# Patient Record
Sex: Male | Born: 1965 | Race: White | Hispanic: No | Marital: Married | Smoking: Former smoker
Health system: Southern US, Community
[De-identification: ages and names within clinical notes are randomized; demographics above are authoritative.]

## PROBLEM LIST (undated history)

## (undated) DIAGNOSIS — Z720 Tobacco use: Secondary | ICD-10-CM

## (undated) DIAGNOSIS — I255 Ischemic cardiomyopathy: Secondary | ICD-10-CM

## (undated) DIAGNOSIS — Z9289 Personal history of other medical treatment: Secondary | ICD-10-CM

## (undated) DIAGNOSIS — I2510 Atherosclerotic heart disease of native coronary artery without angina pectoris: Secondary | ICD-10-CM

## (undated) DIAGNOSIS — D751 Secondary polycythemia: Secondary | ICD-10-CM

## (undated) DIAGNOSIS — I219 Acute myocardial infarction, unspecified: Secondary | ICD-10-CM

## (undated) DIAGNOSIS — M109 Gout, unspecified: Secondary | ICD-10-CM

## (undated) DIAGNOSIS — M1990 Unspecified osteoarthritis, unspecified site: Secondary | ICD-10-CM

## (undated) DIAGNOSIS — E785 Hyperlipidemia, unspecified: Secondary | ICD-10-CM

## (undated) HISTORY — PX: CARDIAC SURGERY: SHX584

## (undated) HISTORY — PX: COLONOSCOPY W/ BIOPSIES: SHX1374

## (undated) HISTORY — PX: DENTAL SURGERY: SHX609

## (undated) HISTORY — DX: Acute myocardial infarction, unspecified: I21.9

---
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
## 1999-06-22 HISTORY — PX: MANDIBLE FRACTURE SURGERY: SHX706

## 1999-06-22 HISTORY — PX: MEDIAL COLLATERAL LIGAMENT REPAIR, KNEE: SHX2019

## 1999-06-22 HISTORY — PX: KNEE ARTHROSCOPY W/ ACL RECONSTRUCTION: SHX1858

## 2000-08-25 ENCOUNTER — Emergency Department (HOSPITAL_COMMUNITY): Admission: EM | Admit: 2000-08-25 | Discharge: 2000-08-25 | Payer: Self-pay | Admitting: Emergency Medicine

## 2000-08-25 ENCOUNTER — Encounter: Payer: Self-pay | Admitting: Emergency Medicine

## 2000-09-05 ENCOUNTER — Encounter: Payer: Self-pay | Admitting: Specialist

## 2000-09-05 ENCOUNTER — Ambulatory Visit (HOSPITAL_COMMUNITY): Admission: RE | Admit: 2000-09-05 | Discharge: 2000-09-05 | Payer: Self-pay | Admitting: Specialist

## 2000-09-14 ENCOUNTER — Observation Stay (HOSPITAL_COMMUNITY): Admission: RE | Admit: 2000-09-14 | Discharge: 2000-09-15 | Payer: Self-pay | Admitting: Specialist

## 2000-12-15 ENCOUNTER — Encounter: Admission: RE | Admit: 2000-12-15 | Discharge: 2001-01-17 | Payer: Self-pay | Admitting: Specialist

## 2001-03-28 ENCOUNTER — Encounter: Admission: RE | Admit: 2001-03-28 | Discharge: 2001-04-21 | Payer: Self-pay | Admitting: *Deleted

## 2005-11-18 ENCOUNTER — Encounter: Admission: RE | Admit: 2005-11-18 | Discharge: 2005-11-18 | Payer: Self-pay | Admitting: Family Medicine

## 2005-12-24 ENCOUNTER — Ambulatory Visit: Payer: Self-pay | Admitting: Family Medicine

## 2006-01-07 ENCOUNTER — Ambulatory Visit: Payer: Self-pay | Admitting: Family Medicine

## 2006-01-17 ENCOUNTER — Ambulatory Visit: Payer: Self-pay | Admitting: Family Medicine

## 2006-02-04 ENCOUNTER — Ambulatory Visit (HOSPITAL_COMMUNITY): Admission: RE | Admit: 2006-02-04 | Discharge: 2006-02-04 | Payer: Self-pay | Admitting: Gastroenterology

## 2006-03-10 ENCOUNTER — Ambulatory Visit (HOSPITAL_COMMUNITY): Admission: RE | Admit: 2006-03-10 | Discharge: 2006-03-10 | Payer: Self-pay | Admitting: Gastroenterology

## 2006-03-28 ENCOUNTER — Encounter: Admission: RE | Admit: 2006-03-28 | Discharge: 2006-03-28 | Payer: Self-pay | Admitting: Gastroenterology

## 2006-04-26 ENCOUNTER — Inpatient Hospital Stay (HOSPITAL_COMMUNITY): Admission: AD | Admit: 2006-04-26 | Discharge: 2006-04-29 | Payer: Self-pay | Admitting: Gastroenterology

## 2006-08-19 ENCOUNTER — Ambulatory Visit: Payer: Self-pay | Admitting: Family Medicine

## 2007-06-24 IMAGING — CT CT ABDOMEN W/ CM
2 of 5 series · 17 of 46 positions shown, 19 images · IV contrast (APPLIED)
Comparison: none

CLINICAL DATA: History of pancreatitis with pseudocyst formation in the tail of the pancreas.  The patient now has recurrent abdominal pain.  
 ABDOMEN CT WITH CONTRAST:
TECHNIQUE: Multidetector CT imaging of the abdomen was performed following the standard protocol during bolus administration of intravenous contrast.
 Contrast:  100 cc Omnipaque 300 IV.  Oral contrast was also administered.
TECHNIQUE: Multidetector CT imaging of the pelvis was performed following the standard protocol during bolus administration of intravenous contrast.

[Series 2: abd/pelv with 5.0 b31f st · axial · 0.69mm/px · z∈[-519,-89]mm · 14 of 98 slices shown, 16 images]
[im 6/98  soft-tissue]
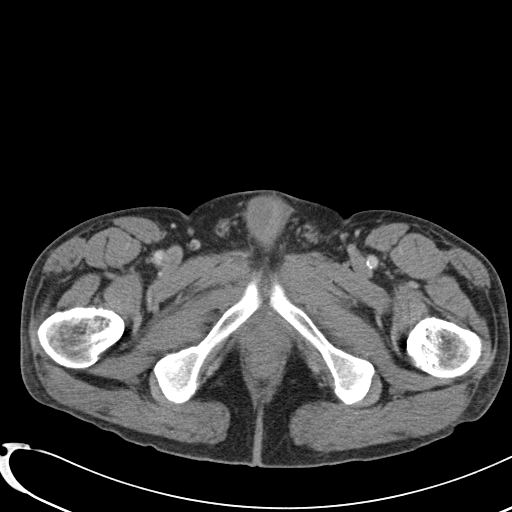
[im 6/98  bone]
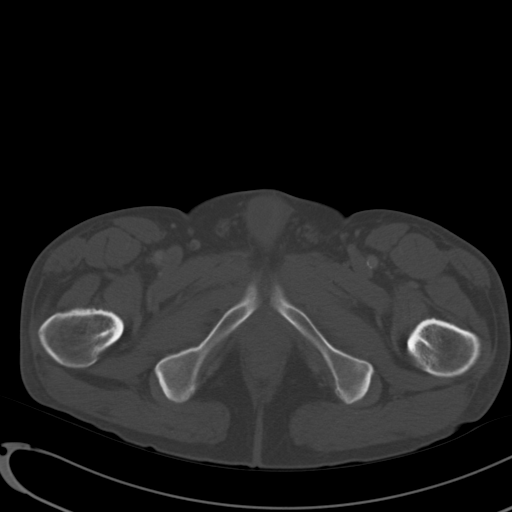
[im 11/98  soft-tissue]
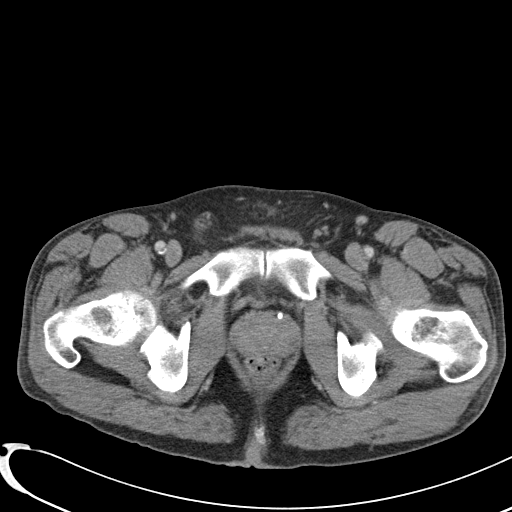
[im 21/98  soft-tissue]
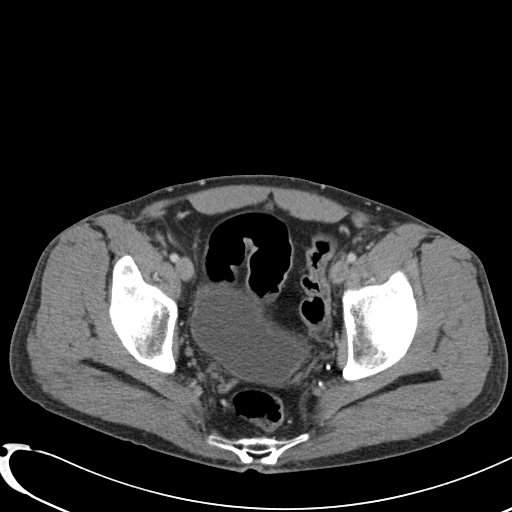
[im 26/98  soft-tissue]
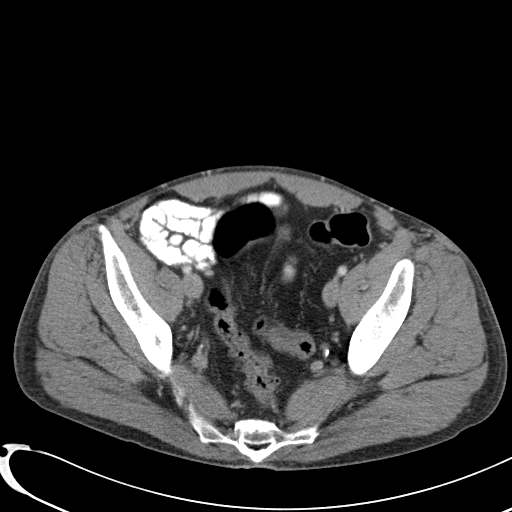
[im 31/98  soft-tissue]
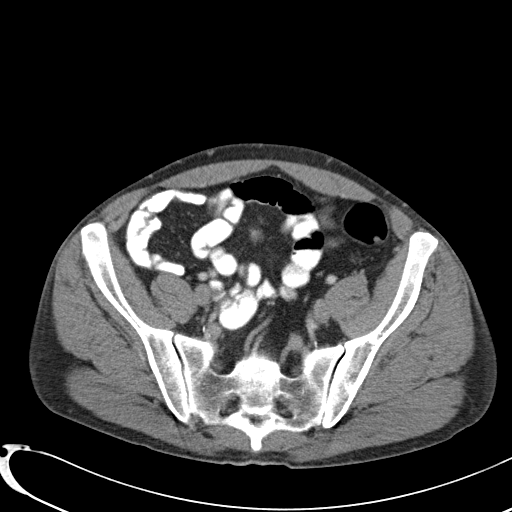
[im 41/98  soft-tissue]
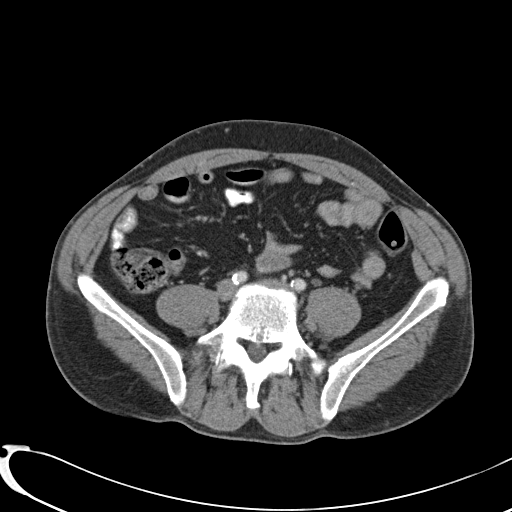
[im 46/98  soft-tissue]
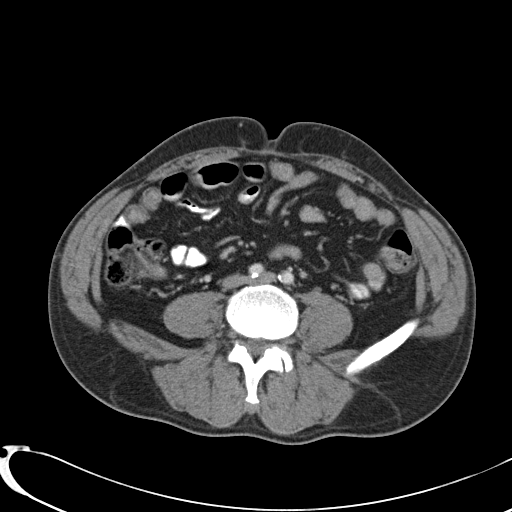
[im 52/98  soft-tissue]
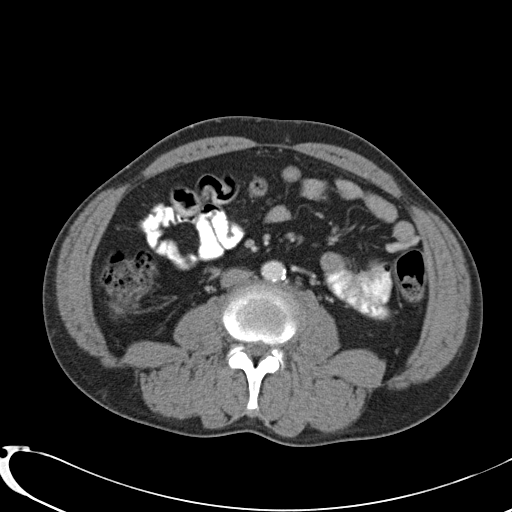
[im 57/98  soft-tissue]
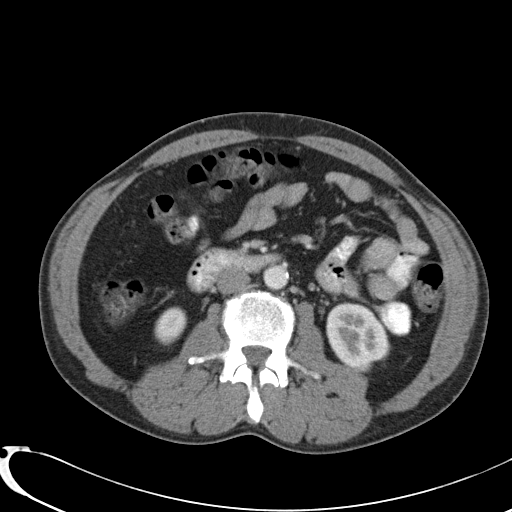
[im 57/98  bone]
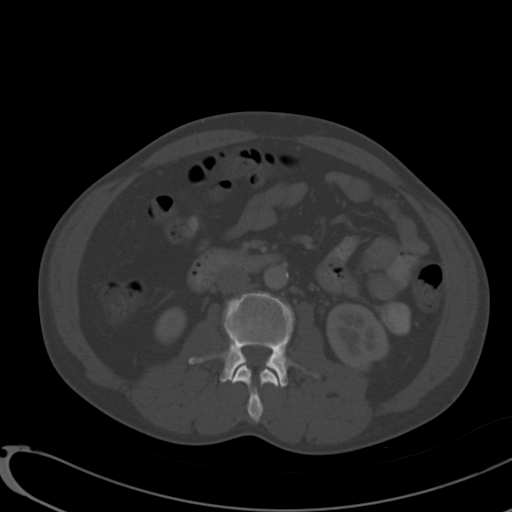
[im 67/98  soft-tissue]
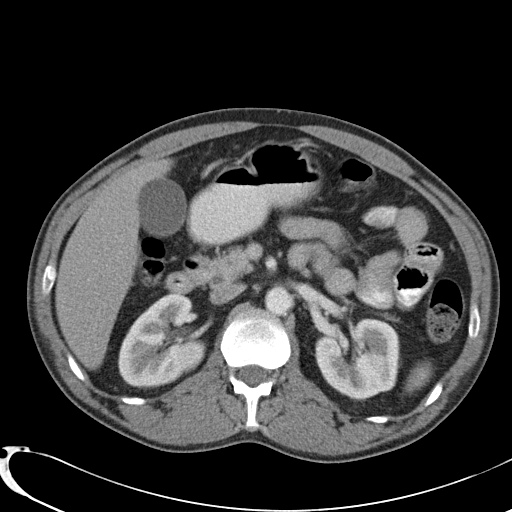
[im 72/98  soft-tissue]
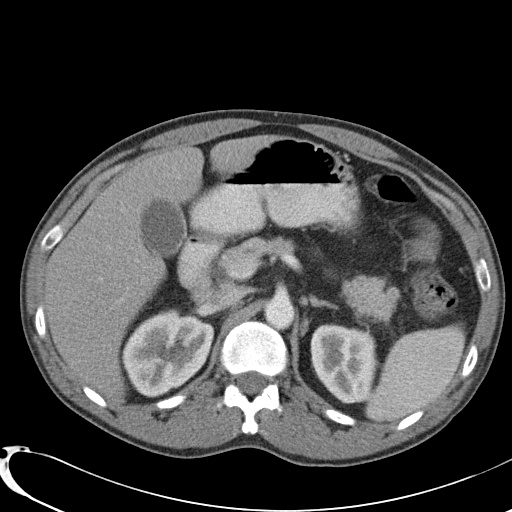
[im 77/98  soft-tissue]
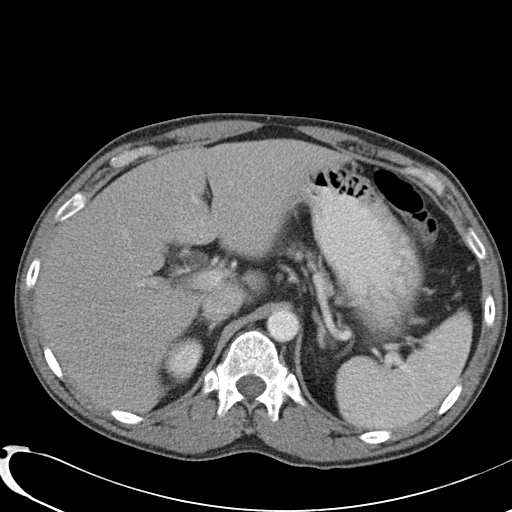
[im 87/98  soft-tissue]
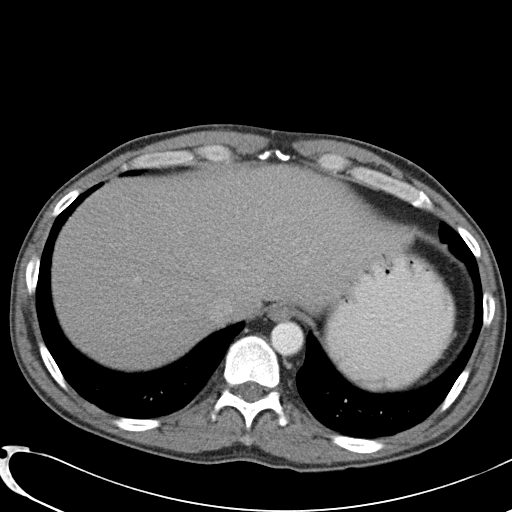
[im 92/98  soft-tissue]
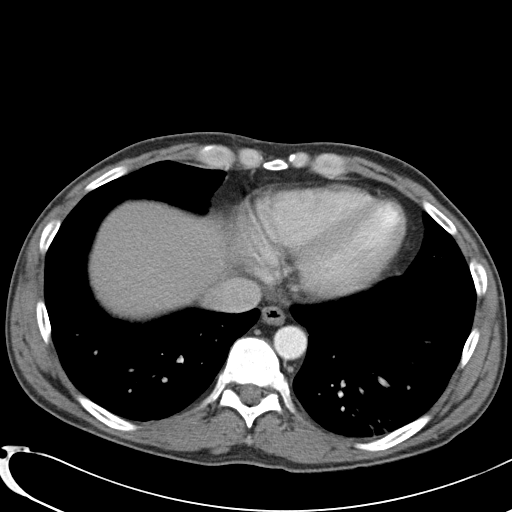

[Series 4: abd/pelv with 2.0 spo st · coronal · 0.95mm/px · 3 of 113 slices shown]
[im 38/113  soft-tissue]
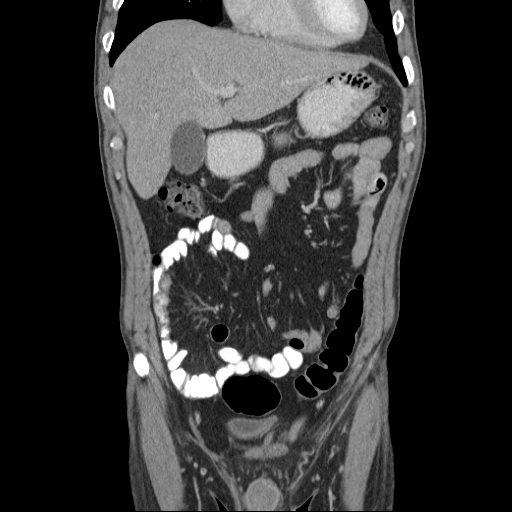
[im 50/113  soft-tissue]
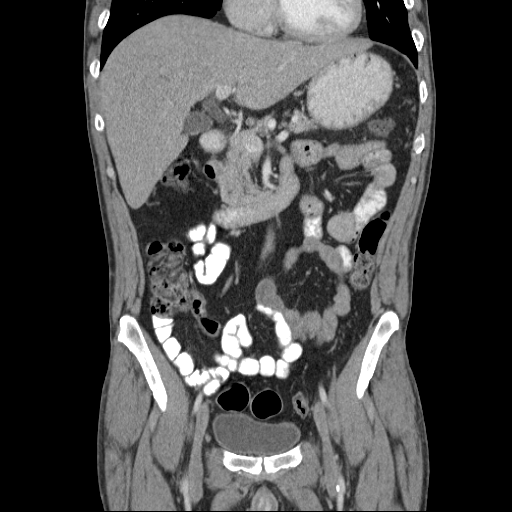
[im 63/113  soft-tissue]
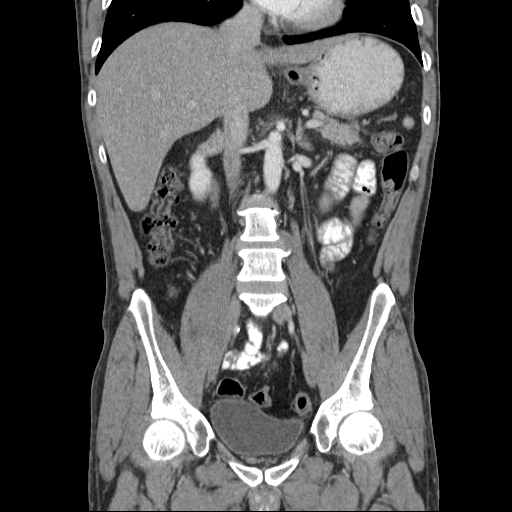

[17 of 46 positions shown; findings below may reference images not displayed]

FINDINGS: The pseudocyst at the tail of the pancreas has nearly completely resolved with only a tiny low attenuation region remaining measuring approximately 1 cm in diameter.  A mild amount of inflammatory change is seen adjacent to the tail of the pancreas.  No new areas of pancreatic inflammation are identified.  There are no new pseudocysts or fluid collections.  The pancreatic duct is not dilated.  The pancreas enhances without evidence of necrosis.  
 The rest of the study shows a stable appearance to the liver, gallbladder, adrenal glands, kidneys and spleen.  Bowel loops are of normal caliber without obstruction.  No free air.
IMPRESSION: Near resolution of pancreatic pseudocyst at the tail of the pancreas as seen on prior imaging.  Only a roughly 1 cm low attenuation area remains with some mild surrounding inflammatory changes.  No new fluid collections or new areas of inflammation. 
 PELVIS CT WITH CONTRAST:
FINDINGS: No free fluid, abscess or bowel dilatation.  The bladder is unremarkable.
IMPRESSION: No acute findings in the pelvis.

## 2012-04-28 ENCOUNTER — Observation Stay (HOSPITAL_COMMUNITY)
Admission: EM | Admit: 2012-04-28 | Discharge: 2012-04-29 | Disposition: A | Discharge status: Home / Self Care | Payer: BC Managed Care – PPO | Attending: Cardiovascular Disease | Admitting: Cardiovascular Disease

## 2012-04-28 ENCOUNTER — Emergency Department (HOSPITAL_COMMUNITY): Payer: BC Managed Care – PPO

## 2012-04-28 ENCOUNTER — Ambulatory Visit (INDEPENDENT_AMBULATORY_CARE_PROVIDER_SITE_OTHER): Payer: BC Managed Care – PPO | Admitting: Family Medicine

## 2012-04-28 ENCOUNTER — Encounter: Payer: Self-pay | Admitting: Family Medicine

## 2012-04-28 ENCOUNTER — Encounter (HOSPITAL_COMMUNITY): Payer: Self-pay | Admitting: General Practice

## 2012-04-28 VITALS — BP 150/80 | HR 103 | Wt 165.0 lb

## 2012-04-28 DIAGNOSIS — D751 Secondary polycythemia: Secondary | ICD-10-CM

## 2012-04-28 DIAGNOSIS — F17200 Nicotine dependence, unspecified, uncomplicated: Secondary | ICD-10-CM | POA: Insufficient documentation

## 2012-04-28 DIAGNOSIS — Z23 Encounter for immunization: Secondary | ICD-10-CM | POA: Insufficient documentation

## 2012-04-28 DIAGNOSIS — R079 Chest pain, unspecified: Secondary | ICD-10-CM

## 2012-04-28 DIAGNOSIS — Z720 Tobacco use: Secondary | ICD-10-CM

## 2012-04-28 DIAGNOSIS — R0789 Other chest pain: Principal | ICD-10-CM | POA: Insufficient documentation

## 2012-04-28 HISTORY — DX: Gout, unspecified: M10.9

## 2012-04-28 HISTORY — DX: Unspecified osteoarthritis, unspecified site: M19.90

## 2012-04-28 HISTORY — DX: Personal history of other medical treatment: Z92.89

## 2012-04-28 LAB — POCT I-STAT, CHEM 8
BUN: 11 mg/dL (ref 6–23)
Calcium, Ion: 1.15 mmol/L (ref 1.12–1.23)
Hemoglobin: 17.7 g/dL — ABNORMAL HIGH (ref 13.0–17.0)
Sodium: 137 mEq/L (ref 135–145)
TCO2: 25 mmol/L (ref 0–100)

## 2012-04-28 LAB — PROTIME-INR
INR: 1 (ref 0.00–1.49)
Prothrombin Time: 13.1 seconds (ref 11.6–15.2)

## 2012-04-28 LAB — POCT I-STAT TROPONIN I: Troponin i, poc: 0.01 ng/mL (ref 0.00–0.08)

## 2012-04-28 MED ORDER — SODIUM CHLORIDE 0.9 % IJ SOLN
3.0000 mL | Freq: Two times a day (BID) | INTRAMUSCULAR | Status: DC
  Administered 2012-04-28: 3 mL via INTRAVENOUS

## 2012-04-28 MED ORDER — BUPROPION HCL ER (SR) 150 MG PO TB12
150.0000 mg | ORAL_TABLET | Freq: Two times a day (BID) | ORAL | Status: DC
  Administered 2012-04-28 – 2012-04-29 (×2): 150 mg via ORAL
  Filled 2012-04-28 (×3): qty 1

## 2012-04-28 MED ORDER — ASPIRIN EC 81 MG PO TBEC
81.0000 mg | DELAYED_RELEASE_TABLET | Freq: Every day | ORAL | Status: DC
  Administered 2012-04-29: 81 mg via ORAL
  Filled 2012-04-28: qty 1

## 2012-04-28 MED ORDER — ASPIRIN 81 MG PO CHEW: 324.0000 mg | CHEWABLE_TABLET | ORAL | Status: DC

## 2012-04-28 MED ORDER — ACETAMINOPHEN 325 MG PO TABS: 650.0000 mg | ORAL_TABLET | ORAL | Status: DC | PRN

## 2012-04-28 MED ORDER — SODIUM CHLORIDE 0.9 % IJ SOLN: 3.0000 mL | INTRAMUSCULAR | Status: DC | PRN

## 2012-04-28 MED ORDER — NITROGLYCERIN 0.4 MG SL SUBL
0.4000 mg | SUBLINGUAL_TABLET | SUBLINGUAL | Status: DC | PRN
  Administered 2012-04-28: 0.4 mg via SUBLINGUAL
  Filled 2012-04-28 (×2): qty 25

## 2012-04-28 MED ORDER — PNEUMOCOCCAL VAC POLYVALENT 25 MCG/0.5ML IJ INJ
0.5000 mL | INJECTION | INTRAMUSCULAR | Status: AC
  Administered 2012-04-29: 0.5 mL via INTRAMUSCULAR
  Filled 2012-04-28: qty 0.5

## 2012-04-28 MED ORDER — ONDANSETRON HCL 4 MG/2ML IJ SOLN: 4.0000 mg | Freq: Four times a day (QID) | INTRAMUSCULAR | Status: DC | PRN

## 2012-04-28 MED ORDER — ASPIRIN 300 MG RE SUPP
300.0000 mg | RECTAL | Status: DC
  Filled 2012-04-28: qty 1

## 2012-04-28 MED ORDER — MORPHINE SULFATE 2 MG/ML IJ SOLN
2.0000 mg | INTRAMUSCULAR | Status: DC | PRN
  Administered 2012-04-28 – 2012-04-29 (×2): 2 mg via INTRAVENOUS
  Filled 2012-04-28 (×2): qty 1

## 2012-04-28 MED ORDER — SODIUM CHLORIDE 0.9 % IV SOLN: 250.0000 mL | INTRAVENOUS | Status: DC | PRN

## 2012-04-28 MED ORDER — NICOTINE 21 MG/24HR TD PT24
21.0000 mg | MEDICATED_PATCH | Freq: Every day | TRANSDERMAL | Status: DC
  Administered 2012-04-28 – 2012-04-29 (×2): 21 mg via TRANSDERMAL
  Filled 2012-04-28 (×3): qty 1

## 2012-04-28 NOTE — ED Notes (Signed)
Came from [facility] for x 3 week of cp. Mid chest pain. Intermittent. Sometimes radiates down both arms. ems 324 mg asa and x 1 ntg. Sl. 0.4mg  without relief. Pain level now is 2-3/10

## 2012-04-28 NOTE — H&P (Signed)
Physician History and Physical  Patient ID: Jermaine Thompson MRN: [id number] DOB/AGE: 46-Dec-1967 16 y.o. Admit date: 04/28/2012  Primary Care Physician: Carollee Herter, MD Primary Cardiologist:  New  Active Problems:  * No active hospital problems. *    HPI:  46 yo heavy smoker with new onset chest pain about 2 weeks ago.  Pain in central chest.  Intermitant some exertional.  2 weeks ago associated with dyspnea and pre syncope.  Continued to have pain last two weeks.  Episodes Lasting longer.  Bending over sometimes helps No GI overtones. No previous CAD.  Drinks on occations. Smokes 2ppd.  Does not take any meds.  Currently pain free POC pending ECG normal other than LAE.  Occasional rest pain This week.  No pleuritic component although he does have a bronchitic cough  CXR with no active disease  Review of systems complete and found to be negative unless listed above   No past medical history on file.  No family history on file.  History   Social History  . Marital Status: Married    Spouse Name: N/A    Number of Children: N/A  . Years of Education: N/A   Occupational History  . Not on file.   Social History Main Topics  . Smoking status: Current Every Day Smoker  . Smokeless tobacco: Never Used  . Alcohol Use: Not on file  . Drug Use: Not on file  . Sexually Active: Not on file   Other Topics Concern  . Not on file   Social History Narrative  . No narrative on file    No past surgical history on file.    (Not in a hospital admission)  Physical Exam: Blood pressure 149/82, pulse 85, temperature 98.8 F (37.1 C), temperature source Oral, resp. rate 17, SpO2 97.00%.   Affect appropriate Looks older than stated age HEENT: normal Neck supple with no adenopathy JVP normal no bruits no thyromegaly Lungs  Rhonchi right upper lung  and good diaphragmatic motion Heart:  S1/S2 no murmur, no rub, gallop or click PMI normal Abdomen: benighn, BS positve, no  tenderness, no AAA no bruit.  No HSM or HJR Distal pulses intact with no bruits No edema Neuro non-focal Skin warm and dry No muscular weakness   Labs:   Lab Results  Component Value Date   HGB 17.7* 04/28/2012   HCT 52.0 04/28/2012    Lab 04/28/12 1708  NA 137  K 4.2  CL 99  CO2 --  BUN 11  CREATININE 1.10  CALCIUM --  PROT --  BILITOT --  ALKPHOS --  ALT --  AST --  GLUCOSE 87    Radiology: Dg Chest 2 View  04/28/2012  *RADIOLOGY REPORT*  Clinical Data:  chest pain  CHEST - 2 VIEW  Comparison: None.  Findings: Cardiomediastinal silhouette is unremarkable.  No acute infiltrate or pleural effusion.  No pulmonary edema.  Bony thorax is stable.  IMPRESSION: No active disease.   Original Report Authenticated By: Natasha Mead, M.D.     EKG: NSR rate 87 LAE no ischemic changes  ASSESSMENT AND PLAN: Chest Pain:  New onset.  Not totally typical of CAD/angina but worrisome.  Discussed options with patient and advised cath on Monday.  He is leary about staying till Monday but will stay tonight ASA R/O serial ECG;s Start heparin if enzymes positive Smoking:  Will give nicotine patch and welbutrin as he is already anxious about not having a cigarette in 3 hours.  CXR ok but lung exam abnormal.  Consider outpatient PFT;s and F/U with  Dr Susann Givens Erythrocytosis:  Likely [place]'s with smoking check ABG at cath follow sats check RBC mass  Signed: Theron Arista Nishan11/01/2012, 6:26 PM
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
## 2012-04-28 NOTE — Progress Notes (Signed)
  Subjective:    Patient ID: Jermaine Thompson, male    DOB: Apr 02, 1966, 46 y.o.   MRN: [id number]  HPI He is here for evaluation of a three-week history of intermittent pressure-like mid chest pain associated with shortness of breath, weakness and occasional diaphoresis. The pains can last anywhere from a minute to 15 minutes. He smokes. His grandfather at 25 had an MI.  Review of Systems     Objective:   Physical Exam Alert and in no distress. Cardiac exam shows regular rhythm without murmurs or gallops. Lungs are clear to auscultation. EKG shows no acute changes.      Assessment & Plan:   1. Chest pain    he was started on oxygen, given aspirin and EMS has been called for transport to the hospital

## 2012-04-29 ENCOUNTER — Encounter (HOSPITAL_COMMUNITY): Payer: Self-pay | Admitting: Physician Assistant

## 2012-04-29 DIAGNOSIS — D751 Secondary polycythemia: Secondary | ICD-10-CM

## 2012-04-29 DIAGNOSIS — Z720 Tobacco use: Secondary | ICD-10-CM

## 2012-04-29 MED ORDER — PANTOPRAZOLE SODIUM 40 MG PO TBEC: 40.0000 mg | DELAYED_RELEASE_TABLET | Freq: Every day | ORAL | Status: DC

## 2012-04-29 MED ORDER — ALUM & MAG HYDROXIDE-SIMETH 200-200-20 MG/5ML PO SUSP
15.0000 mL | Freq: Once | ORAL | Status: AC
  Administered 2012-04-29: 15 mL via ORAL
  Filled 2012-04-29: qty 30

## 2012-04-29 MED ORDER — BUPROPION HCL ER (SR) 150 MG PO TB12: 150.0000 mg | ORAL_TABLET | Freq: Two times a day (BID) | ORAL | Status: DC

## 2012-04-29 MED ORDER — NICOTINE 7 MG/24HR TD PT24: 1.0000 | MEDICATED_PATCH | TRANSDERMAL | Status: DC

## 2012-04-29 MED ORDER — NICOTINE 21 MG/24HR TD PT24: 1.0000 | MEDICATED_PATCH | Freq: Every day | TRANSDERMAL | Status: AC

## 2012-04-29 MED ORDER — NITROGLYCERIN 0.4 MG SL SUBL: 0.4000 mg | SUBLINGUAL_TABLET | SUBLINGUAL | Status: DC | PRN

## 2012-04-29 MED ORDER — ASPIRIN 81 MG PO TBEC: 81.0000 mg | DELAYED_RELEASE_TABLET | Freq: Every day | ORAL | Status: AC

## 2012-04-29 NOTE — Progress Notes (Signed)
Pt resting in bed and stated he was continuing to have episodes of intermittent chest pain. Pt's VS stable. Md on call made aware. Maalox given to pt. Morning EKG obtained. No new orders received at this time. Will cont to monitor pt.

## 2012-04-29 NOTE — Progress Notes (Signed)
Pt resting in bed and keeps having intermittent chest pain. Pt stated that the pain becomes very intense all of sudden and then eases up. Pt describes the pain to be a tightness in his chest and stated he feels some tingling in his right a left fingers when the pain becomes severe. Pt stated it feels like the same pain he has been having that brought him to the hospital. Pt's VS stable (see flow sheet). Pt's SR per cardiac monitor. An EKG was obtained. Pt has received 2 mg of Morphine. Pt has also received 1 sl nitro. Md on call made aware. No new orders received. Will cont to monitor pt.

## 2012-04-29 NOTE — Progress Notes (Signed)
Patient ID: Jermaine Thompson, male   DOB: 1966-02-13, 46 y.o.   MRN: [id number] Subjective:  Minimal non-exertional pain in the night, lasting 1-3 minutes, resolved spontaneously  Objective:  Vital Signs in the last 24 hours: Temp:  [97.5 F (36.4 C)-98.8 F (37.1 C)] 97.5 F (36.4 C) (11/09 0500) Pulse Rate:  [71-103] 71  (11/09 0500) Resp:  [17-18] 18  (11/09 0500) BP: (101-150)/(68-82) 101/68 mmHg (11/09 0500) SpO2:  [96 %-99 %] 96 % (11/09 0500) Weight:  [165 lb (74.844 kg)] 165 lb (74.844 kg) (11/08 1933)  Intake/Output from previous day: 11/08 0701 - 11/09 0700 In: 240 [P.O.:240] Out: 1400 [Urine:1400] Intake/Output from this shift: Total I/O In: 240 [P.O.:240] Out: -   Physical Exam: Well appearing NAD HEENT: Unremarkable Neck:  No JVD, no thyromegally Lungs:  Clear with no wheezes HEART:  Regular rate rhythm, no murmurs, no rubs, no clicks Abd:  Flat, positive bowel sounds, no organomegally, no rebound, no guarding Ext:  2 plus pulses, no edema, no cyanosis, no clubbing Skin:  No rashes no nodules Neuro:  CN II through XII intact, motor grossly intact  Lab Results:  Basename 04/28/12 1708  WBC --  HGB 17.7*  PLT --    Basename 04/28/12 1708  NA 137  K 4.2  CL 99  CO2 --  GLUCOSE 87  BUN 11  CREATININE 1.10    Basename 04/29/12 0610  TROPONINI <0.30   Hepatic Function Panel No results found for this basename: PROT,ALBUMIN,AST,ALT,ALKPHOS,BILITOT,BILIDIR,IBILI in the last 72 hours No results found for this basename: CHOL in the last 72 hours No results found for this basename: PROTIME in the last 72 hours  Imaging: Dg Chest 2 View  04/28/2012  *RADIOLOGY REPORT*  Clinical Data:  chest pain  CHEST - 2 VIEW  Comparison: None.  Findings: Cardiomediastinal silhouette is unremarkable.  No acute infiltrate or pleural effusion.  No pulmonary edema.  Bony thorax is stable.  IMPRESSION: No active disease.   Original Report Authenticated By: Natasha Mead, M.D.      Cardiac Studies: Tele - nsr Assessment/Plan:  1. Chest pain, non-exertional with negative ECG's and enzymes. I have recommended outpatient stress test early next week, start on protonix with a prescription at discharge, and no strenuous activity until after stress test. Smoking cessation has been discussed. He will need ASA at discharge and a prescription for slntg.  LOS: 1 day    Gregg Taylor,M.D. 04/29/2012, 10:40 AM

## 2012-04-29 NOTE — Progress Notes (Signed)
Utilization Review Completed.   Zyhir Cappella, RN, BSN Nurse Case Manager  [phone]  

## 2012-04-29 NOTE — Discharge Summary (Signed)
Discharge Summary   Patient ID: Jermaine Thompson,  MRN: [id number], DOB/AGE: December 22, 1965 46 y.o.  Admit date: 04/28/2012 Discharge date: 04/29/2012  Primary Physician: Carollee Herter, MD Primary Cardiologist: New to cardiology- initially seen by Dr. Eden Emms in consultation  Discharge Diagnoses Principal Problem:  *Atypical chest pain Active Problems:  Erythrocytosis  Tobacco abuse   Allergies No Known Allergies  Diagnostic Studies/Procedures  PA/LATERAL CHEST X-RAY - 04/28/12  Comparison: None.  Findings: Cardiomediastinal silhouette is unremarkable. No acute infiltrate or pleural effusion. No pulmonary edema. Bony thorax  is stable.  IMPRESSION:  No active disease.  History of Present Illness  Jermaine Thompson is a 46yo male with no prior cardiac history, tobacco abuse, gout, musculoskeletal issues and otherwise minimal prior medical history who was placed in observation at [facility] hospital overnight for chest pain to rule out a cardiac etiology.   He had presented to his PCP's (Dr. Susann Givens) office c/o a 2-3 week history of intermittent SSCP w/ assoc SOB, weakness and diaphoresis lasting from 1-15 minutes. He endorsed smoking 2 PPD, occasional EtOH use. No elicit drug use. He was given a full-dose ASA, started on O2 and transported to [facility] via EMS.   There, EKG and initial troponin revealed no acute ischemia. CXR as above revealed no acute process. CBC did reveal an erythrocytosis likely secondary to ongoing tobacco abuse. Given his history concerning for USAP and ongoing tobacco abuse, he was formally placed in observation overnight to rule out for acute cardiac ischemia with an aim for diagnostic cath on Monday.   Hospital Course   He was started on Wellbutrin and Nicoderm for tobacco cessation assistance. He was started on a daily low-dose ASA. He remained stable overnight, with a mild self-limiting 1-3 minute episode of chest pain at rest. The following morning, an additional  troponin returned WNL. He was assessed by Dr. Ladona Ridgel, and on further questioning, the patient reported his chest pain is non-exertional and fleeting when it does occur. The decision was made to plan for outpatient stress testing over cath given these atypical qualities to his chest discomfort. He will be discharged on the medications below. He will follow-up in the office next week for an exercise Myoview. He has been instructed not to exert himself strenuously until after the results of his stress test are known. Outpatient PFTs have been recommended to assess for underlying pulmonary dysfunction. He has been advised to follow-up with his PCP for this. This information has been clearly outlined in the discharge AVS.   Discharge Vitals:  Blood pressure 101/68, pulse 71, temperature 97.5 F (36.4 C), temperature source Oral, resp. rate 18, height 6\' 1"  (1.854 m), weight 74.844 kg (165 lb), SpO2 96.00%.   Labs: Recent Labs  Basename 04/28/12 1708   WBC --   HGB 17.7*   HCT 52.0   MCV --   PLT --    Lab 04/28/12 1708  NA 137  K 4.2  CL 99  CO2 --  BUN 11  CREATININE 1.10  CALCIUM --  PROT --  BILITOT --  ALKPHOS --  ALT --  AST --  AMYLASE --  LIPASE --  GLUCOSE 87   Recent Labs  Basename 04/29/12 0610   CKTOTAL --   CKMB --   CKMBINDEX --   TROPONINI <0.30   Disposition:   Follow-up Information    Follow up with [facility]. (Office will call for appointment date and time next week. )    Contact information:  [address] [place] [address]          Discharge Medications:    Medication List     As of 04/29/2012  1:35 PM    START taking these medications         aspirin 81 MG EC tablet   Take 1 tablet (81 mg total) by mouth daily.      buPROPion 150 MG 12 hr tablet   Commonly known as: WELLBUTRIN SR   Take 1 tablet (150 mg total) by mouth 2 (two) times daily.      * nicotine 21 mg/24hr patch   Commonly known as: NICODERM CQ -  dosed in mg/24 hours   Place 1 patch onto the skin daily.      * nicotine 7 mg/24hr patch   Commonly known as: NICODERM CQ - dosed in mg/24 hr   Place 1 patch onto the skin daily.   Start taking on: 05/21/2012      nitroGLYCERIN 0.4 MG SL tablet   Commonly known as: NITROSTAT   Place 1 tablet (0.4 mg total) under the tongue every 5 (five) minutes x 3 doses as needed for chest pain.     * Notice: This list has 2 medication(s) that are the same as other medications prescribed for you. Read the directions carefully, and ask your doctor or other care provider to review them with you.        Where to get your medications    These are the prescriptions that you need to pick up. We sent them to a specific pharmacy, so you will need to go there to get them.   [id number] - [place], [place] - [address], [address] AT [facility]    [address], [address] [place] [address]    Phone: [phone]        buPROPion 150 MG 12 hr tablet   nicotine 21 mg/24hr patch   nicotine 7 mg/24hr patch   nitroGLYCERIN 0.4 MG SL tablet         Information on where to get these meds is not yet available. Ask your nurse or doctor.         aspirin 81 MG EC tablet           Outstanding Labs/Studies: exercise Myoview next week (11/11-11/15)  Duration of Discharge Encounter: Greater than 30 minutes including physician time.  Signed, R. Hurman Horn, PA-C 04/29/2012, 1:35 PM

## 2012-05-01 NOTE — Addendum Note (Signed)
Addended by: Ronnald Nian on: 05/01/2012 03:16 PM   Modules accepted: Orders

## 2012-05-04 ENCOUNTER — Ambulatory Visit (HOSPITAL_BASED_OUTPATIENT_CLINIC_OR_DEPARTMENT_OTHER): Payer: BC Managed Care – PPO | Admitting: Radiology

## 2012-05-04 VITALS — BP 145/80 | Ht 72.0 in | Wt 158.0 lb

## 2012-05-04 DIAGNOSIS — R079 Chest pain, unspecified: Secondary | ICD-10-CM

## 2012-05-04 DIAGNOSIS — R61 Generalized hyperhidrosis: Secondary | ICD-10-CM | POA: Insufficient documentation

## 2012-05-04 DIAGNOSIS — R0602 Shortness of breath: Secondary | ICD-10-CM | POA: Insufficient documentation

## 2012-05-04 DIAGNOSIS — R42 Dizziness and giddiness: Secondary | ICD-10-CM | POA: Insufficient documentation

## 2012-05-04 DIAGNOSIS — F17200 Nicotine dependence, unspecified, uncomplicated: Secondary | ICD-10-CM | POA: Insufficient documentation

## 2012-05-04 DIAGNOSIS — Z8249 Family history of ischemic heart disease and other diseases of the circulatory system: Secondary | ICD-10-CM | POA: Insufficient documentation

## 2012-05-04 MED ORDER — TECHNETIUM TC 99M SESTAMIBI GENERIC - CARDIOLITE
11.0000 | Freq: Once | INTRAVENOUS | Status: AC | PRN
  Administered 2012-05-04: 11 via INTRAVENOUS

## 2012-05-04 MED ORDER — TECHNETIUM TC 99M SESTAMIBI GENERIC - CARDIOLITE
33.0000 | Freq: Once | INTRAVENOUS | Status: AC | PRN
  Administered 2012-05-04: 33 via INTRAVENOUS

## 2012-05-04 NOTE — Progress Notes (Signed)
[facility] SITE 3 NUCLEAR MED [id number] [place] [address] [phone]  Cardiology Nuclear Med Study  Jermaine Thompson is a 46 y.o. male     MRN : [id number]     DOB: 25-Sep-1965  Procedure Date: 05/04/2012  Nuclear Med Background Indication for Stress Test:  Evaluation for Ischemia, Post Hospital and 04/28/2012 [facility] CP (-) enzymes/EKG History:  n/a Cardiac Risk Factors: Family History - CAD and Smoker  Symptoms:  Chest Pain, Chest Pain with Exertion (last date of chest discomfort 05/04/12), Diaphoresis, Dizziness, Near Syncope and SOB   Nuclear Pre-Procedure Caffeine/Decaff Intake:  None NPO After: 8:00am   Lungs:  clear O2 Sat: 98% on room air. IV 0.9% NS with Angio Cath:  20g  IV Site: R Antecubital  IV Started by:  Milana Na, EMT-P  Chest Size (in):  40/42 Cup Size: n/a  Height: 6' (1.829 m)  Weight:  158 lb (71.668 kg)  BMI:  Body mass index is 21.43 kg/(m^2). Tech Comments:  Rx this am    Nuclear Med Study 1 or 2 day study: 1 day  Stress Test Type:  Stress  Reading MD: Kristeen Miss, MD  Order Authorizing Provider:  P.Nishan  Resting Radionuclide: Technetium 44m Sestamibi  Resting Radionuclide Dose: 11.0 mCi   Stress Radionuclide:  Technetium 8m Sestamibi  Stress Radionuclide Dose: 33.0 mCi           Stress Protocol Rest HR: 85 Stress HR: 148  Rest BP: 145/83 Stress BP: 217/85  Exercise Time (min): 7:34 METS: 9.40   Predicted Max HR: 174 bpm % Max HR: 85.06 bpm Rate Pressure Product: 62952   Dose of Adenosine (mg):  n/a Dose of Lexiscan: n/a mg  Dose of Atropine (mg): n/a Dose of Dobutamine: n/a mcg/kg/min (at max HR)  Stress Test Technologist: Frederick Peers, EMT-P  Nuclear Technologist:  Harlow Asa, CNMT     Rest Procedure:  Myocardial perfusion imaging was performed at rest 45 minutes following the intravenous administration of Technetium 38m Sestamibi Rest ECG: NSR - Normal EKG  Stress Procedure:  The  patient performed treadmill exercise using a Bruce  Protocol for 7:34 minutes. The patient stopped due to SOB and denied any chest pain.Pt with HTN response.  There were no significant ST-T wave changes.  Technetium 6m Sestamibi was injected at peak exercise and myocardial perfusion imaging was performed after a brief delay. Stress ECG: No significant change from baseline ECG  QPS Raw Data Images:  Normal; no motion artifact; normal heart/lung ratio. Stress Images:  There is a small severe defect in the basal  inferior wall with normal uptake in the remaining walls.  Rest Images:  There is a small severe defect in the basal inferior wall with normal uptake in the remaining walls.    Subtraction (SDS):  No evidence of ischemia.  There is a inferobasal scar.  Transient Ischemic Dilatation (Normal <1.22):  1.32 Lung/Heart Ratio (Normal <0.45):  0.31  Quantitative Gated Spect Images QGS EDV:  120 ml QGS ESV:  64 ml  Impression Exercise Capacity:  Fair exercise capacity. BP Response:  Normal blood pressure response. Clinical Symptoms:  No significant symptoms noted. ECG Impression:  No significant ST segment change suggestive of ischemia. Comparison with Prior Nuclear Study: No images to compare  Overall Impression:  Low risk stress nuclear study.  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   LV Ejection Fraction: 47%.  LV Wall Motion:  There is hypokinesis of the inferior basal wall with fairly normal contractility in the remaining walls.     Vesta Mixer, Montez Hageman., MD, [facility] 05/04/2012, 6:17 PM Office - [phone] Pager [phone]

## 2012-05-05 ENCOUNTER — Telehealth: Payer: Self-pay | Admitting: *Deleted

## 2012-05-05 ENCOUNTER — Other Ambulatory Visit: Payer: Self-pay

## 2012-05-05 ENCOUNTER — Encounter (HOSPITAL_COMMUNITY): Payer: Self-pay | Admitting: Physician Assistant

## 2012-05-05 ENCOUNTER — Emergency Department (HOSPITAL_COMMUNITY): Payer: BC Managed Care – PPO

## 2012-05-05 ENCOUNTER — Ambulatory Visit (HOSPITAL_COMMUNITY): Admit: 2012-05-05 | Payer: Self-pay | Admitting: Cardiovascular Disease

## 2012-05-05 ENCOUNTER — Inpatient Hospital Stay (HOSPITAL_COMMUNITY)
Admission: EM | Admit: 2012-05-05 | Discharge: 2012-05-06 | DRG: 854 | Disposition: A | Discharge status: Home / Self Care | Payer: BC Managed Care – PPO | Attending: Cardiovascular Disease | Admitting: Cardiovascular Disease

## 2012-05-05 ENCOUNTER — Encounter (HOSPITAL_COMMUNITY)
Admission: EM | Disposition: A | Discharge status: Home / Self Care | Payer: Self-pay | Source: Home / Self Care | Attending: Cardiovascular Disease

## 2012-05-05 DIAGNOSIS — R55 Syncope and collapse: Secondary | ICD-10-CM | POA: Diagnosis present

## 2012-05-05 DIAGNOSIS — K219 Gastro-esophageal reflux disease without esophagitis: Secondary | ICD-10-CM | POA: Diagnosis present

## 2012-05-05 DIAGNOSIS — I200 Unstable angina: Secondary | ICD-10-CM | POA: Diagnosis present

## 2012-05-05 DIAGNOSIS — I2589 Other forms of chronic ischemic heart disease: Secondary | ICD-10-CM | POA: Diagnosis present

## 2012-05-05 DIAGNOSIS — I2510 Atherosclerotic heart disease of native coronary artery without angina pectoris: Secondary | ICD-10-CM

## 2012-05-05 DIAGNOSIS — D751 Secondary polycythemia: Secondary | ICD-10-CM | POA: Diagnosis present

## 2012-05-05 DIAGNOSIS — F17200 Nicotine dependence, unspecified, uncomplicated: Secondary | ICD-10-CM | POA: Diagnosis present

## 2012-05-05 DIAGNOSIS — Z79899 Other long term (current) drug therapy: Secondary | ICD-10-CM

## 2012-05-05 DIAGNOSIS — M129 Arthropathy, unspecified: Secondary | ICD-10-CM | POA: Diagnosis present

## 2012-05-05 DIAGNOSIS — Z7982 Long term (current) use of aspirin: Secondary | ICD-10-CM

## 2012-05-05 DIAGNOSIS — Z720 Tobacco use: Secondary | ICD-10-CM

## 2012-05-05 DIAGNOSIS — Z955 Presence of coronary angioplasty implant and graft: Secondary | ICD-10-CM

## 2012-05-05 HISTORY — DX: Ischemic cardiomyopathy: I25.5

## 2012-05-05 HISTORY — DX: Secondary polycythemia: D75.1

## 2012-05-05 HISTORY — DX: Hyperlipidemia, unspecified: E78.5

## 2012-05-05 HISTORY — PX: LEFT HEART CATHETERIZATION WITH CORONARY ANGIOGRAM: SHX5451

## 2012-05-05 HISTORY — DX: Tobacco use: Z72.0

## 2012-05-05 HISTORY — DX: Atherosclerotic heart disease of native coronary artery without angina pectoris: I25.10

## 2012-05-05 LAB — COMPREHENSIVE METABOLIC PANEL
Alkaline Phosphatase: 67 U/L (ref 39–117)
BUN: 8 mg/dL (ref 6–23)
Creatinine, Ser: 0.8 mg/dL (ref 0.50–1.35)
GFR calc Af Amer: >90 mL/min (ref >90)
Glucose, Bld: 96 mg/dL (ref 70–99)
Potassium: 4.1 mEq/L (ref 3.5–5.1)
Total Bilirubin: 0.6 mg/dL (ref 0.3–1.2)
Total Protein: 7.2 g/dL (ref 6.0–8.3)

## 2012-05-05 LAB — CBC
HCT: 47.2 % (ref 39.0–52.0)
Hemoglobin: 17.1 g/dL — ABNORMAL HIGH (ref 13.0–17.0)
MCHC: 36.2 g/dL — ABNORMAL HIGH (ref 30.0–36.0)
MCV: 91.1 fL (ref 78.0–100.0)

## 2012-05-05 LAB — TROPONIN I: Troponin I: <0.3 ng/mL (ref <0.30)

## 2012-05-05 LAB — URINALYSIS, ROUTINE W REFLEX MICROSCOPIC
Glucose, UA: NEGATIVE mg/dL
Hgb urine dipstick: NEGATIVE
Ketones, ur: NEGATIVE mg/dL
Protein, ur: NEGATIVE mg/dL

## 2012-05-05 LAB — GLUCOSE, CAPILLARY: Glucose-Capillary: 110 mg/dL — ABNORMAL HIGH (ref 70–99)

## 2012-05-05 LAB — PROTIME-INR: Prothrombin Time: 13.3 seconds (ref 11.6–15.2)

## 2012-05-05 SURGERY — LEFT HEART CATHETERIZATION WITH CORONARY ANGIOGRAM
Anesthesia: LOCAL

## 2012-05-05 MED ORDER — SODIUM CHLORIDE 0.9 % IJ SOLN: 3.0000 mL | INTRAMUSCULAR | Status: DC | PRN

## 2012-05-05 MED ORDER — ACETAMINOPHEN 325 MG PO TABS: 650.0000 mg | ORAL_TABLET | ORAL | Status: DC | PRN

## 2012-05-05 MED ORDER — ACETAMINOPHEN 325 MG PO TABS
325.0000 mg | ORAL_TABLET | Freq: Four times a day (QID) | ORAL | Status: DC | PRN
  Administered 2012-05-05: 21:00:00 650 mg via ORAL
  Filled 2012-05-05: qty 2

## 2012-05-05 MED ORDER — SODIUM CHLORIDE 0.9 % IV SOLN
1000.0000 mL | INTRAVENOUS | Status: DC
  Administered 2012-05-05: 1000 mL via INTRAVENOUS

## 2012-05-05 MED ORDER — HEPARIN SODIUM (PORCINE) 1000 UNIT/ML IJ SOLN
INTRAMUSCULAR | Status: AC
  Filled 2012-05-05: qty 1

## 2012-05-05 MED ORDER — ATORVASTATIN CALCIUM 80 MG PO TABS
80.0000 mg | ORAL_TABLET | ORAL | Status: DC
  Filled 2012-05-05: qty 1

## 2012-05-05 MED ORDER — HEPARIN (PORCINE) IN NACL 2-0.9 UNIT/ML-% IJ SOLN
INTRAMUSCULAR | Status: AC
  Filled 2012-05-05: qty 1000

## 2012-05-05 MED ORDER — NITROGLYCERIN 0.4 MG SL SUBL: 0.4000 mg | SUBLINGUAL_TABLET | SUBLINGUAL | Status: DC | PRN

## 2012-05-05 MED ORDER — NICOTINE 21 MG/24HR TD PT24
21.0000 mg | MEDICATED_PATCH | Freq: Every day | TRANSDERMAL | Status: DC
  Administered 2012-05-05: 21 mg via TRANSDERMAL
  Filled 2012-05-05 (×2): qty 1

## 2012-05-05 MED ORDER — MIDAZOLAM HCL 2 MG/2ML IJ SOLN
INTRAMUSCULAR | Status: AC
  Filled 2012-05-05: qty 2

## 2012-05-05 MED ORDER — ZOLPIDEM TARTRATE 5 MG PO TABS: 5.0000 mg | ORAL_TABLET | Freq: Every evening | ORAL | Status: DC | PRN

## 2012-05-05 MED ORDER — SODIUM CHLORIDE 0.9 % IJ SOLN: 3.0000 mL | Freq: Two times a day (BID) | INTRAMUSCULAR | Status: DC

## 2012-05-05 MED ORDER — NITROGLYCERIN 2 % TD OINT
0.5000 [in_us] | TOPICAL_OINTMENT | Freq: Once | TRANSDERMAL | Status: AC
  Administered 2012-05-05: 0.5 [in_us] via TOPICAL

## 2012-05-05 MED ORDER — PRASUGREL HCL 10 MG PO TABS
ORAL_TABLET | ORAL | Status: AC
  Filled 2012-05-05: qty 6

## 2012-05-05 MED ORDER — ASPIRIN 81 MG PO CHEW: 324.0000 mg | CHEWABLE_TABLET | Freq: Once | ORAL | Status: AC

## 2012-05-05 MED ORDER — PANTOPRAZOLE SODIUM 40 MG PO TBEC: 40.0000 mg | DELAYED_RELEASE_TABLET | Freq: Every day | ORAL | Status: DC

## 2012-05-05 MED ORDER — NITROGLYCERIN 0.2 MG/ML ON CALL CATH LAB
INTRAVENOUS | Status: AC
  Filled 2012-05-05: qty 1

## 2012-05-05 MED ORDER — VERAPAMIL HCL 2.5 MG/ML IV SOLN
INTRAVENOUS | Status: AC
  Filled 2012-05-05: qty 2

## 2012-05-05 MED ORDER — LIDOCAINE HCL (PF) 1 % IJ SOLN
INTRAMUSCULAR | Status: AC
  Filled 2012-05-05: qty 30

## 2012-05-05 MED ORDER — HEPARIN SODIUM (PORCINE) 5000 UNIT/ML IJ SOLN
4000.0000 [IU] | Freq: Once | INTRAMUSCULAR | Status: AC
  Administered 2012-05-05: 4000 [IU] via INTRAVENOUS
  Filled 2012-05-05: qty 1

## 2012-05-05 MED ORDER — SODIUM CHLORIDE 0.9 % IV SOLN: 250.0000 mL | INTRAVENOUS | Status: DC | PRN

## 2012-05-05 MED ORDER — ATORVASTATIN CALCIUM 80 MG PO TABS
80.0000 mg | ORAL_TABLET | Freq: Every day | ORAL | Status: DC
  Filled 2012-05-05: qty 1

## 2012-05-05 MED ORDER — BUPROPION HCL ER (SR) 150 MG PO TB12
150.0000 mg | ORAL_TABLET | Freq: Two times a day (BID) | ORAL | Status: DC
  Administered 2012-05-05: 150 mg via ORAL
  Filled 2012-05-05 (×3): qty 1

## 2012-05-05 MED ORDER — FENTANYL CITRATE 0.05 MG/ML IJ SOLN
INTRAMUSCULAR | Status: AC
  Filled 2012-05-05: qty 2

## 2012-05-05 MED ORDER — ONDANSETRON HCL 4 MG/2ML IJ SOLN: 4.0000 mg | Freq: Four times a day (QID) | INTRAMUSCULAR | Status: DC | PRN

## 2012-05-05 MED ORDER — DIAZEPAM 5 MG PO TABS: 5.0000 mg | ORAL_TABLET | ORAL | Status: DC

## 2012-05-05 MED ORDER — NITROGLYCERIN 2 % TD OINT: 0.5000 [in_us] | TOPICAL_OINTMENT | Freq: Once | TRANSDERMAL | Status: DC

## 2012-05-05 MED ORDER — ZOLPIDEM TARTRATE 5 MG PO TABS
5.0000 mg | ORAL_TABLET | Freq: Every evening | ORAL | Status: DC | PRN
  Administered 2012-05-05: 5 mg via ORAL
  Filled 2012-05-05: qty 1

## 2012-05-05 MED ORDER — SODIUM CHLORIDE 0.9 % IV SOLN
INTRAVENOUS | Status: AC
  Administered 2012-05-05: 19:00:00 via INTRAVENOUS

## 2012-05-05 MED ORDER — ASPIRIN EC 81 MG PO TBEC
81.0000 mg | DELAYED_RELEASE_TABLET | Freq: Every day | ORAL | Status: DC
  Filled 2012-05-05 (×2): qty 1

## 2012-05-05 MED ORDER — HEPARIN BOLUS VIA INFUSION: 5000.0000 [IU] | Freq: Once | INTRAVENOUS | Status: DC

## 2012-05-05 MED ORDER — ALPRAZOLAM 0.25 MG PO TABS: 0.2500 mg | ORAL_TABLET | Freq: Two times a day (BID) | ORAL | Status: DC | PRN

## 2012-05-05 MED ORDER — NITROGLYCERIN 0.3 MG/HR TD PT24: 0.3000 mg | MEDICATED_PATCH | Freq: Every day | TRANSDERMAL | Status: DC

## 2012-05-05 MED ORDER — METOPROLOL TARTRATE 25 MG PO TABS
25.0000 mg | ORAL_TABLET | Freq: Two times a day (BID) | ORAL | Status: DC
  Filled 2012-05-05 (×2): qty 1

## 2012-05-05 MED ORDER — PRASUGREL HCL 10 MG PO TABS
10.0000 mg | ORAL_TABLET | Freq: Every day | ORAL | Status: DC
  Filled 2012-05-05: qty 1

## 2012-05-05 MED ORDER — ATORVASTATIN CALCIUM 80 MG PO TABS: 80.0000 mg | ORAL_TABLET | Freq: Every day | ORAL | Status: DC

## 2012-05-05 MED ORDER — BIVALIRUDIN 250 MG IV SOLR
INTRAVENOUS | Status: AC
  Filled 2012-05-05: qty 250

## 2012-05-05 MED ORDER — OXYCODONE-ACETAMINOPHEN 5-325 MG PO TABS
1.0000 | ORAL_TABLET | ORAL | Status: DC | PRN
  Administered 2012-05-05 – 2012-05-06 (×2): 1 via ORAL
  Filled 2012-05-05 (×2): qty 1
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
## 2012-05-05 NOTE — ED Notes (Signed)
Pt told RN that he just had an episode of chest pain that lasted approx 1 minute. Pt states that he feels "like someone has a belt around my chest squeezing'. Pt reports that he also felt hot and lightheaded while he had the pain. Pt denies chest pain now.

## 2012-05-05 NOTE — Interval H&P Note (Signed)
History and Physical Interval Note:  05/05/2012 4:38 PM  Jermaine Thompson  has presented today for cardiac cath with the diagnosis chest pain.  The various methods of treatment have been discussed with the patient and family. After consideration of risks, benefits and other options for treatment, the patient has consented to  Procedure(s) (LRB) with comments: LEFT HEART CATHETERIZATION WITH CORONARY ANGIOGRAM (N/A) as a surgical intervention .  The patient's history has been reviewed, patient examined, no change in status, stable for surgery.  I have reviewed the patient's chart and labs.  Questions were answered to the patient's satisfaction.     MCALHANY,CHRISTOPHER

## 2012-05-05 NOTE — ED Notes (Signed)
Per EMS pt. Was having cp at work and feeling hot, pt. Kneeled down and had a syncopal episode his head on bench. 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 Was unconscious appx 30 seconds. Pt got 324 ASA . Pt CP free now. Pt. Had stress test done yesterday that showed previous MI. No other cardiac history

## 2012-05-05 NOTE — Telephone Encounter (Signed)
LEFT MESSAGE FOR PT TO CALL BACK  NEEDS F/U TO DISCUSS ABN MYOVIEW WITH   DR Eden Emms OR PA   MAY NEED A CATH .Jermaine Thompson

## 2012-05-05 NOTE — Progress Notes (Signed)
TR BAND REMOVAL  LOCATION:    right radial  DEFLATED PER PROTOCOL:    yes  TIME BAND OFF / DRESSING APPLIED:    21:30   SITE UPON ARRIVAL:    Level 0  SITE AFTER BAND REMOVAL:    Level 0  REVERSE ALLEN'S TEST:     positive  CIRCULATION SENSATION AND MOVEMENT:    Within Normal Limits   yes  COMMENTS:    

## 2012-05-05 NOTE — CV Procedure (Signed)
Cardiac Catheterization Operative Report  Jermaine Thompson [id number] 11/15/20135:47 PM Carollee Herter, MD  Procedure Performed:  1. Left Heart Catheterization 2. Selective Coronary Angiography 3. Left ventricular angiogram 4. PTCA/DES x mid RCA  Operator: Verne Carrow, MD  Arterial access site:  Right radial artery.   Indication:  46 yo WM with history of tobacco abuse admitted with chest pain. Abnormal myoview yesterday in our office with inferior wall scar with ? Ischemia.                                    Procedure Details: The risks, benefits, complications, treatment options, and expected outcomes were discussed with the patient. The patient and/or family concurred with the proposed plan, giving informed consent. The patient was brought to the cath lab after IV hydration was begun and oral premedication was given. The patient was further sedated with Versed and Fentanyl. The right wrist was assessed with an Allens test which was positive. The right wrist was prepped and draped in a sterile fashion. 1% lidocaine was used for local anesthesia. Using the modified Seldinger access technique, a 5 French sheath was placed in the right radial artery. 3 mg Verapamil was given through the sheath. 4000 units IV heparin was given. Standard diagnostic catheters were used to perform selective coronary angiography. A pigtail catheter was used to perform a left ventricular angiogram. The patient was found to have a severe stenosis of the mid RCA. The sheath was upsized to a 6 [place] system. He was given a bolus of Angiomax and a drip was started. He was given Effient 60 mg po x 1. I then engaged the RCA with a 6 [place] JR4 guide with sideholes. When the ACT was greater than 200, I passed the BMW wire down the RCA. When the wire crossed the stenosis, there was no flow down the vessel. The patient had 5 mm ST elevation and chest pain. There was also spasm around the guiding catheter. I  quickly dilated the lesion with a 2.5 x 12 mm balloon. I then deployed a 3.0 x 18 mm Xience Expedition drug eluting stent in the mid RCA. This was post-dilated with a 3.25 x 15 mm Dunlap balloon taken to 16 atm. After stent deployment, there was excellent flow down the vessel. There was, however, severe spasm before and after the stent. He was given multiple rounds of NTG IC and the spasm resolved. There was an excellent angiographic result. The stenosis was taken from 99% down to 0%.   The sheath was removed from the right radial artery and a Terumo hemostasis band was applied at the arteriotomy site on the right wrist.    There were no immediate complications. The patient was taken to the recovery area in stable condition.   Hemodynamic Findings: Central aortic pressure: 133/66 Left ventricular pressure:  132/4/10  Angiographic Findings:  Left main:  No obstructive disease.   Left Anterior Descending Artery: Large caliber vessel that courses to the apex. There is a moderate sized diagonal branch. There is no obstructive disease in this system.   Circumflex Artery: Large caliber vessel with a large obtuse marginal branch. There is no obstructive disease.   Right Coronary Artery: Large dominant vessel with 99% mid stenosis. The remainder of the vessel is free of obstructive disease.   Left Ventricular Angiogram: LVEF 50%. Inferior wall hypokinesis.   Impression: 1. Severe single vessel CAD  2. Unstable angina 3. Successful PTCA/DES x 1 mid RCA 4. Low normal LV systolic function.   Recommendations: He will need ASA and Effient for at least one year. Will also start statin and beta blocker as tolerated.        Complications:  None. The patient tolerated the procedure well.

## 2012-05-06 ENCOUNTER — Encounter (HOSPITAL_COMMUNITY): Payer: Self-pay | Admitting: *Deleted

## 2012-05-06 DIAGNOSIS — I2510 Atherosclerotic heart disease of native coronary artery without angina pectoris: Principal | ICD-10-CM

## 2012-05-06 LAB — CBC
HCT: 44.4 % (ref 39.0–52.0)
Hemoglobin: 15.7 g/dL (ref 13.0–17.0)
MCHC: 35.4 g/dL (ref 30.0–36.0)
RDW: 12.8 % (ref 11.5–15.5)
WBC: 8.2 10*3/uL (ref 4.0–10.5)

## 2012-05-06 LAB — BASIC METABOLIC PANEL
BUN: 8 mg/dL (ref 6–23)
Chloride: 101 mEq/L (ref 96–112)
GFR calc Af Amer: >90 mL/min (ref >90)
GFR calc non Af Amer: >90 mL/min (ref >90)
Glucose, Bld: 90 mg/dL (ref 70–99)
Potassium: 3.6 mEq/L (ref 3.5–5.1)
Sodium: 136 mEq/L (ref 135–145)

## 2012-05-06 LAB — TROPONIN I
Troponin I: <0.3 ng/mL (ref <0.30)
Troponin I: <0.3 ng/mL (ref <0.30)

## 2012-05-06 LAB — LIPID PANEL
Cholesterol: 182 mg/dL (ref 0–200)
Total CHOL/HDL Ratio: 3.8 RATIO

## 2012-05-06 LAB — TSH: TSH: 4.272 u[IU]/mL (ref 0.350–4.500)

## 2012-05-06 MED ORDER — ATORVASTATIN CALCIUM 80 MG PO TABS: 80.0000 mg | ORAL_TABLET | Freq: Every day | ORAL | Status: DC

## 2012-05-06 MED ORDER — METOPROLOL TARTRATE 25 MG PO TABS: 25.0000 mg | ORAL_TABLET | Freq: Two times a day (BID) | ORAL | Status: DC

## 2012-05-06 MED ORDER — LISINOPRIL 2.5 MG PO TABS: 2.5000 mg | ORAL_TABLET | Freq: Every day | ORAL | Status: DC

## 2012-05-06 MED ORDER — PRASUGREL HCL 10 MG PO TABS: 10.0000 mg | ORAL_TABLET | Freq: Every day | ORAL | Status: DC

## 2012-05-06 NOTE — Progress Notes (Signed)
CARDIAC REHAB PHASE I   PRE:  Rate/Rhythm: 81SR  BP:  Supine:   Sitting: 145/84  Standing:    SaO2: 100%RA  MODE:  Ambulation: 700 ft   POST:  Rate/Rhythem: 97SR  BP:  Supine:   Sitting: 145/83  Standing:    SaO2:  0720-0822 Pt walked 700 ft with steady gait. Tolerated well. Denied CP. Education completed with pt and wife. Permission given to refer to [facility]. Discussed smoking cessation. Handouts given. Pt has already cut down to about 10 cigarettes a day. Wife is going to try to quit also.   Jermaine Thompson

## 2012-05-06 NOTE — Discharge Summary (Signed)
Discharge Summary   Patient ID: Jermaine Thompson,  MRN: [id number], DOB/AGE: 1965/08/22 46 y.o.  Admit date: 05/05/2012 Discharge date: 05/06/2012   Primary Care Physician:  Carollee Herter   Primary Cardiologist:  Dr. [name]  Primary Electrophysiologist:  None   Reason for Admission:  Unstable Angina  Primary Discharge Diagnoses:   1. Unstable Angina - MI ruled out; Single vessel CAD on cardiac cath treated with DES this admission. 2. Syncope - In setting of unstable angina (no recurrence) 3. Ischemic Cardiomyopathy  Secondary Discharge Diagnoses:   Past Medical History  Diagnosis Date  . CAD in native artery     a. MV11/2013: low risk but with prior inf-basal MI with no ischemia, EF 47%.;  b. LHC 11/13: mRCA 99%, EF 50% with Inf HK => c. PCI:  Xience DES to mRCA    . History of blood transfusion 1980's  . Arthritis     "right knee" (04/28/2012)  . Gout     "once; long time ago" (04/28/2012)  . Tobacco abuse   . HLD (hyperlipidemia)   . Erythrocytosis     ? due to smoking  . Ischemic cardiomyopathy     EF 47% by nuclear and 50% by cath 04/2012     Allergies:   No Known Allergies    Procedures Performed This Admission:    Cardiac Catheterization 05/05/12:  Hemodynamic Findings:  Central aortic pressure: 133/66  Left ventricular pressure: 132/4/10  Angiographic Findings:  Left main: No obstructive disease.  Left Anterior Descending Artery: Large caliber vessel that courses to the apex. There is a moderate sized diagonal branch. There is no obstructive disease in this system.  Circumflex Artery: Large caliber vessel with a large obtuse marginal branch. There is no obstructive disease.  Right Coronary Artery: Large dominant vessel with 99% mid stenosis. The remainder of the vessel is free of obstructive disease.  Left Ventricular Angiogram: LVEF 50%. Inferior Nelva Hauk hypokinesis.  Impression:  1. Severe single vessel CAD  2. Unstable angina  3. Successful  PTCA/DES x 1 mid RCA  4. Low normal LV systolic function.  Recommendations: He will need ASA and Effient for at least one year. Will also start statin and beta blocker as tolerated.    Hospital Course: Jermaine Thompson is a 45 y.o. male  with no previously diagnosed cardiac disease. He has a history of significant tobacco abuse. He was admitted about a week ago chest pain. Myocardial infarction was ruled out. Outpatient stress testing was undertaken. This was performed 05/04/12 and demonstrated an EF of 47% with inferior hypokinesis consistent with prior scar but no ischemia. Patient presented to the emergency room with recurrent chest discomfort. He also had an episode of syncope in the setting of chest discomfort. He was transported via EMS. Serial cardiac markers were normal. 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 This was treated with a Xience DES. Dual antiplatelet therapy was recommended for one year. Patient had no arrhythmias on telemetry. No further episodes of syncope. He was evaluated on the date of discharge.  He is pain free.  He is felt stable for discharge to home.  He will remain on aspirin, Effient, high-dose statin, metoprolol. Low-dose lisinopril will be added at discharge. He will need followup labs in 1 week.  Discharge Vitals: Blood pressure 145/83, pulse 74, temperature 97.3 F (36.3 C), temperature source Oral, resp. rate 13, height 6' (1.829 m), weight  159 lb 12.8 oz (72.485 kg), SpO2 98.00%.   Labs: Lab Results  Component Value Date   WBC 8.2 05/06/2012   HGB 15.7 05/06/2012   HCT 44.4 05/06/2012   MCV 92.3 05/06/2012   PLT 170 05/06/2012     Lab 05/06/12 0157 05/05/12 1343  NA 136 --  K 3.6 --  CL 101 --  CO2 27 --  BUN 8 --  CREATININE 0.95 --  CALCIUM 8.6 --  PROT -- 7.2  BILITOT -- 0.6  ALKPHOS -- 67  ALT -- 22  AST -- 27  GLUCOSE 90 --    Basename 05/06/12 0158 05/05/12 1924    CKTOTAL -- --  CKMB -- --  TROPONINI <0.30 <0.30   Lab Results  Component Value Date   CHOL 182 05/06/2012   HDL 48 05/06/2012   LDLCALC 97 05/06/2012   TRIG 184* 05/06/2012   No results found for this basename: DDIMER   Lab Results  Component Value Date   TSH 4.272 05/05/2012    Diagnostic Procedures and Studies:  Dg Chest 2 View  04/28/2012    IMPRESSION: No active disease.   Original Report Authenticated By: Natasha Mead, M.D.    Dg Chest Portable 1 View  05/05/2012    IMPRESSION: 1. No radiographic evidence of acute cardiopulmonary disease.   Original Report Authenticated By: Trudie Reed, M.D.     Disposition  Pt is being discharged home today in good condition.  Follow-up Plans & Appointments      Follow-up Information    Follow up with Carollee Herter, MD.   Contact information:   [address] [place] [address] [phone]       Follow up with [name], MD. In 2 weeks. (Office will call to see Dr. Eden Emms or PA Lorin Picket Alben Spittle, [place]))    Contact information:   [address] [place]lyn [place] [address] [phone]          Discharge Medications    Medication List     As of 05/06/2012  9:33 AM    TAKE these medications         aspirin 81 MG EC tablet   Take 1 tablet (81 mg total) by mouth daily.      atorvastatin 80 MG tablet   Commonly known as: LIPITOR   Take 1 tablet (80 mg total) by mouth daily at 6 PM.      buPROPion 150 MG 12 hr tablet   Commonly known as: WELLBUTRIN SR   Take 1 tablet (150 mg total) by mouth 2 (two) times daily.      lisinopril 2.5 MG tablet   Commonly known as: PRINIVIL,ZESTRIL   Take 1 tablet (2.5 mg total) by mouth daily.      metoprolol tartrate 25 MG tablet   Commonly known as: LOPRESSOR   Take 1 tablet (25 mg total) by mouth 2 (two) times daily.      nicotine 21 mg/24hr patch   Commonly known as: NICODERM CQ - dosed in mg/24 hours   Place 1 patch  onto the skin daily.      nitroGLYCERIN 0.4 MG SL tablet   Commonly known as: NITROSTAT   Place 1 tablet (0.4 mg total) under the tongue every 5 (five) minutes x 3 doses as needed for chest pain.      pantoprazole 40 MG tablet   Commonly known as: PROTONIX   Take 1 tablet (40 mg total) by mouth  daily.      prasugrel 10 MG Tabs   Commonly known as: EFFIENT   Take 1 tablet (10 mg total) by mouth daily.         Outstanding Labs/Studies 1. BMET in 1 week after starting ACE inhibitor 2. Lipids and LFTs in 6-8 weeks.   Duration of Discharge Encounter: Greater than 30 minutes including physician and PA time.  Signed, Tereso Newcomer, PA-C  9:33 AM 05/06/2012        [address] [place], [place]  [address] Phone: [phone] Fax:  [phone] Jesse Sans. Daleen Squibb, MD, [facility] Pager:  [phone]

## 2012-05-06 NOTE — Progress Notes (Signed)
Progress Note  Name:  Jermaine Thompson  Date:  05/06/2012 8:50 AM    Subjective:  He is doing well. He denies chest pain or shortness of breath. He denies lightheadedness   Objective:   Filed Vitals:   05/05/12 2000 05/06/12 0000 05/06/12 0500 05/06/12 0814  BP: 139/80 133/73 136/78 145/83  Pulse: 69 70 69 74  Temp: 97.9 F (36.6 C) 97.8 F (36.6 C) 97.4 F (36.3 C) 97.3 F (36.3 C)  TempSrc: Axillary Oral Oral Oral  Resp: 13 12 16 13   Height:      Weight:   159 lb 12.8 oz (72.485 kg)   SpO2: 100% 100% 100% 98%    Intake/Output Summary (Last 24 hours) at 05/06/12 0850 Last data filed at 05/06/12 0000  Gross per 24 hour  Intake   1020 ml  Output   1550 ml  Net   -530 ml    Tele:  Sinus rhythm  PHYSICAL EXAM General: no acute distress Neck:  No JVD Lungs: Clear to auscultation bilaterally  Heart: normal S1, S2;  Regular rate and rhythm, no murmur Abdomen: soft, nontender Extremities: no clubbing, cyanosis or edema; right wrist without hematoma or bruit  Skin:  No rash; warm and dry Psych:  Normal affect  Labs:  [facility] 05/06/12 0157 05/05/12 1343  NA 136 133*  K 3.6 4.1  CL 101 98  CO2 27 22  GLUCOSE 90 96  BUN 8 8  CREATININE 0.95 0.80  CALCIUM 8.6 9.3  MG -- --  PHOS -- --    Basename 05/05/12 1343  AST 27  ALT 22  ALKPHOS 67  BILITOT 0.6  PROT 7.2  ALBUMIN 3.8   No results found for this basename: LIPASE:2,AMYLASE:2 in the last 72 hours  Basename 05/06/12 0157 05/05/12 1343  WBC 8.2 9.8  NEUTROABS -- --  HGB 15.7 17.1*  HCT 44.4 47.2  MCV 92.3 91.1  PLT 170 190    Basename 05/06/12 0158 05/05/12 1924  CKTOTAL -- --  CKMB -- --  TROPONINI <0.30 <0.30    Basename 05/05/12 1924  TSH 4.272  T4TOTAL --  T3FREE --  THYROIDAB --   No results found for this basename: VITAMINB12,FOLATE,FERRITIN,TIBC,IRON,RETICCTPCT in the last 72 hours  Radiology/Studies:  Dg Chest 2 View  04/28/2012  *RADIOLOGY REPORT*  Clinical Data:  chest pain   CHEST - 2 VIEW  Comparison: None.  Findings: Cardiomediastinal silhouette is unremarkable.  No acute infiltrate or pleural effusion.  No pulmonary edema.  Bony thorax is stable.  IMPRESSION: No active disease.   Original Report Authenticated By: Natasha Mead, M.D.    Dg Chest Portable 1 View  05/05/2012  *RADIOLOGY REPORT*  Clinical Data: Right-sided chest pressure and dizziness.  Syncope.  PORTABLE CHEST - 1 VIEW  Comparison: Chest x-ray 04/28/2012.  Findings: Lung volumes are normal.  No consolidative airspace disease.  No pleural effusions.  No pneumothorax.  No pulmonary nodule or mass noted.  Pulmonary vasculature and the cardiomediastinal silhouette are within normal limits.  IMPRESSION: 1. No radiographic evidence of acute cardiopulmonary disease.   Original Report Authenticated By: Trudie Reed, M.D.    Cardiac Catheterization 05/05/12: Hemodynamic Findings:  Central aortic pressure: 133/66  Left ventricular pressure: 132/4/10  Angiographic Findings:  Left main: No obstructive disease.  Left Anterior Descending Artery: Large caliber vessel that courses to the apex. There is a moderate sized diagonal branch. There is no obstructive disease in this system.  Circumflex Artery: Large caliber vessel with  a large obtuse marginal branch. There is no obstructive disease.  Right Coronary Artery: Large dominant vessel with 99% mid stenosis. The remainder of the vessel is free of obstructive disease.  Left Ventricular Angiogram: LVEF 50%. Inferior Nadiah Corbit hypokinesis.  Impression:  1. Severe single vessel CAD  2. Unstable angina  3. Successful PTCA/DES x 1 mid RCA  4. Low normal LV systolic function.  Recommendations: He will need ASA and Effient for at least one year. Will also start statin and beta blocker as tolerated.    Assessment and Plan:   Unstable angina  -  MI ruled out.  Single vessel CAD noted on LHC.  He is s/p DES to [facility].  Plan DAPT for one year.  Plan discharge later today after  seen by attending physician. Followup with Dr. Eden Emms or Tereso Newcomer, PA-C in 2 weeks.  Active Problems:  Erythrocytosis   -  Likely related to smoking hx.  Cessation recommended.   Tobacco abuse  -  Cessation recommended. He is currently cutting back and plans to quit soon with the aid of nicotine patches.   Syncope  -  Likely related to ischemia.  EF low normal.  No significant arrhythmias on telemetry. Recommend no driving until seen in followup.   GERD (gastroesophageal reflux disease)  -  Continue PPI.    Coronary Artery Disease  -   Continue ASA, Effient, Metoprolol and Lipitor.     Ischemic Cardiomyopathy  -   EF 47% on nuclear study and 50% on cardiac catheterization with inferior Jonmichael Beadnell motion abnormality. Continue metoprolol. Add lisinopril 2.5 mg daily. He will need a basic metabolic panel in one week.    Signed, Tereso Newcomer, PA-C  8:50 AM 05/06/2012    Patient examined and agree except changes made.Discussed plan with patient. All questions answered.  Valera Castle, MD 05/06/2012 3:52 PM

## 2012-05-08 ENCOUNTER — Telehealth: Payer: Self-pay | Admitting: Family Medicine

## 2012-05-08 ENCOUNTER — Telehealth: Payer: Self-pay | Admitting: Cardiovascular Disease

## 2012-05-08 MED FILL — Dextrose Inj 5%: INTRAVENOUS | Qty: 50 | Status: AC

## 2012-05-08 NOTE — Telephone Encounter (Signed)
Called pt home # [email or web address].  Called work# they said out of work for couple of weeks. Need follow up ov, see how he is doing and did they start any new meds?

## 2012-05-08 NOTE — Telephone Encounter (Signed)
New Problem:    I called and left a message for the patient to schedule and appointment to have a Bmet drawn sometime during the week of 05/08/12 per the after hours voicemail and fax.

## 2012-05-09 ENCOUNTER — Other Ambulatory Visit: Payer: Self-pay | Admitting: *Deleted

## 2012-05-09 ENCOUNTER — Telehealth: Payer: Self-pay | Admitting: Family Medicine

## 2012-05-09 NOTE — Telephone Encounter (Signed)
Called pt for ov from er discharge.  lmtrc.

## 2012-05-10 ENCOUNTER — Encounter: Payer: Self-pay | Admitting: Family Medicine

## 2012-05-10 ENCOUNTER — Encounter: Payer: Self-pay | Admitting: Physician Assistant

## 2012-05-10 ENCOUNTER — Other Ambulatory Visit (INDEPENDENT_AMBULATORY_CARE_PROVIDER_SITE_OTHER): Payer: BC Managed Care – PPO

## 2012-05-10 ENCOUNTER — Other Ambulatory Visit: Payer: Self-pay | Admitting: *Deleted

## 2012-05-10 ENCOUNTER — Telehealth: Payer: Self-pay | Admitting: *Deleted

## 2012-05-10 ENCOUNTER — Encounter: Payer: Self-pay | Admitting: *Deleted

## 2012-05-10 ENCOUNTER — Ambulatory Visit (INDEPENDENT_AMBULATORY_CARE_PROVIDER_SITE_OTHER): Payer: BC Managed Care – PPO | Admitting: Family Medicine

## 2012-05-10 ENCOUNTER — Ambulatory Visit (INDEPENDENT_AMBULATORY_CARE_PROVIDER_SITE_OTHER): Payer: BC Managed Care – PPO | Admitting: Physician Assistant

## 2012-05-10 VITALS — BP 116/80 | HR 80 | Wt 162.0 lb

## 2012-05-10 VITALS — BP 111/72 | HR 58 | Ht 72.0 in | Wt 163.6 lb

## 2012-05-10 DIAGNOSIS — I200 Unstable angina: Secondary | ICD-10-CM

## 2012-05-10 DIAGNOSIS — E785 Hyperlipidemia, unspecified: Secondary | ICD-10-CM

## 2012-05-10 DIAGNOSIS — I2589 Other forms of chronic ischemic heart disease: Secondary | ICD-10-CM

## 2012-05-10 DIAGNOSIS — F17200 Nicotine dependence, unspecified, uncomplicated: Secondary | ICD-10-CM

## 2012-05-10 DIAGNOSIS — I2510 Atherosclerotic heart disease of native coronary artery without angina pectoris: Secondary | ICD-10-CM

## 2012-05-10 DIAGNOSIS — Z720 Tobacco use: Secondary | ICD-10-CM

## 2012-05-10 DIAGNOSIS — R0989 Other specified symptoms and signs involving the circulatory and respiratory systems: Secondary | ICD-10-CM

## 2012-05-10 LAB — BASIC METABOLIC PANEL
BUN: 11 mg/dL (ref 6–23)
CO2: 27 mEq/L (ref 19–32)
Calcium: 9.1 mg/dL (ref 8.4–10.5)
Chloride: 94 mEq/L — ABNORMAL LOW (ref 96–112)
Creatinine, Ser: 0.9 mg/dL (ref 0.4–1.5)
Glucose, Bld: 88 mg/dL (ref 70–99)

## 2012-05-10 NOTE — Telephone Encounter (Signed)
PT HAD CATH ON 05-05-12

## 2012-05-10 NOTE — Progress Notes (Signed)
[address], [address] [place], [place]  [address] Phone: [phone], Fax:  [phone]  Date:  05/10/2012   Name:  Jermaine Thompson   DOB:  Oct 30, 1965   MRN:  [id number]  PCP:  Carollee Herter, MD  Primary Cardiologist:  Dr. [name]  Primary Electrophysiologist:  None    History of Present Illness: Jermaine Thompson is a 46 y.o. male who returns for follow up after recent admission to the hospital for unstable angina.  He has a history of significant tobacco abuse. He was admitted a few weeks ago with chest pain. MI was ruled out. Outpatient Myoview demonstrated EF of 47% with inferior hypokinesis consistent with prior scar but no ischemia. He returned to the ED with recurrent chest discomfort and syncope. Serial cardiac markers were normal. 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 This was treated with a Xience DES. Dual antiplatelet therapy was recommended for one year. Patient had no arrhythmias on telemetry. Since d/c, he is doing well.  The patient denies chest pain, shortness of breath, syncope, orthopnea, PND or significant pedal edema.   Labs (11/13):   K 3.6, creatinine 0.95, ALT 22, LDL 97, Hgb 15.7, TSH 4.272  Wt Readings from Last 3 Encounters:  05/10/12 163 lb 9.6 oz (74.208 kg)  05/10/12 162 lb (73.483 kg)  05/06/12 159 lb 12.8 oz (72.485 kg)     Past Medical History  Diagnosis Date  . CAD in native artery     a. MV11/2013: low risk but with prior inf-basal MI with no ischemia, EF 47%.;  b. LHC 11/13: mRCA 99%, EF 50% with Inf HK => c. PCI:  Xience DES to mRCA    . History of blood transfusion 1980's  . Arthritis     "right knee" (04/28/2012)  . Gout     "once; long time ago" (04/28/2012)  . Tobacco abuse   . HLD (hyperlipidemia)   . Erythrocytosis     ? due to smoking  . Ischemic cardiomyopathy     EF 47% by nuclear and 50% by cath 04/2012    Current Outpatient  Prescriptions  Medication Sig Dispense Refill  . aspirin EC 81 MG EC tablet Take 1 tablet (81 mg total) by mouth daily.      Marland Kitchen atorvastatin (LIPITOR) 80 MG tablet Take 1 tablet (80 mg total) by mouth daily at 6 PM.  30 tablet  5  . buPROPion (WELLBUTRIN SR) 150 MG 12 hr tablet Take 1 tablet (150 mg total) by mouth 2 (two) times daily.  60 tablet  3  . lisinopril (ZESTRIL) 2.5 MG tablet Take 1 tablet (2.5 mg total) by mouth daily.  30 tablet  5  . metoprolol tartrate (LOPRESSOR) 25 MG tablet Take 1 tablet (25 mg total) by mouth 2 (two) times daily.  60 tablet  11  . nicotine (NICODERM CQ - DOSED IN MG/24 HOURS) 21 mg/24hr patch Place 1 patch onto the skin daily.  28 patch  0  . nitroGLYCERIN (NITROSTAT) 0.4 MG SL tablet Place 1 tablet (0.4 mg total) under the tongue every 5 (five) minutes x 3 doses as needed for chest pain.  25 tablet  3  . pantoprazole (PROTONIX) 40 MG tablet Take 1 tablet (40 mg total) by mouth daily.  30 tablet  3  . prasugrel (EFFIENT) 10 MG TABS Take 1 tablet (10 mg total) by mouth daily.  30  tablet  11    Allergies:   No Known Allergies  Social History:  The patient  reports that he has been smoking.  He has never used smokeless tobacco. He reports that he drinks about 7.2 ounces of alcohol per week. He reports that he does not use illicit drugs.   ROS:  Please see the history of present illness.    All other systems reviewed and negative.   PHYSICAL EXAM: VS:  BP 111/72  Pulse 58  Ht 6' (1.829 m)  Wt 163 lb 9.6 oz (74.208 kg)  BMI 22.19 kg/m2 Well nourished, well developed, in no acute distress HEENT: normal Neck: no JVD Cardiac:  normal S1, S2; RRR; no murmur Lungs:  clear to auscultation bilaterally, no wheezing, rhonchi or rales Abd: soft, nontender, no hepatomegaly Ext: no edema; right wrist without hematoma or bruit  Skin: warm and dry Neuro:  CNs 2-12 intact, no focal abnormalities noted  EKG:  Sinus brady, HR 56, rightward axis, no acute changes       ASSESSMENT AND PLAN:  1. Coronary Artery Disease:   Doing well after PCI to RCA for [place].  We discussed the importance of dual antiplatelet therapy.  He would like to return to work.  He is an Financial trader.  He does not do anything strenuous.  His syncope occurred prior to his PCI.  He may return to work next week and he may drive.  Follow up with Dr. [name] in 6 weeks.    2. Ischemic Cardiomyopathy:   Continue beta blocker and ACE.  Check BMET today.  3. Hyperlipidemia:   Continue Lipitor.  Check L/L in 6-8 weeks.  4. Tobacco Abuse:   He is in the process of quitting.   Luna Glasgow, PA-C  11:02 AM 05/10/2012

## 2012-05-10 NOTE — Patient Instructions (Signed)
Continue to work on smoking cessation and if you have trouble please call me. Stay on all your medications

## 2012-05-10 NOTE — Telephone Encounter (Signed)
Message copied by Tarri Fuller on Wed May 10, 2012  5:53 PM ------      Message from: [place], [place] T      Created: Wed May 10, 2012  5:09 PM       Na level slightly low.      Make sure he is not drinking too much water or fluids.      Also ask if he drinks much beer - this can lower Na      Restrict fluids to 50-60 oz per day      Repeat BMET in one week      Tereso Newcomer, PA-C  5:09 PM 05/10/2012

## 2012-05-10 NOTE — Patient Instructions (Addendum)
Your physician recommends that you continue on your current medications as directed. Please refer to the Current Medication list given to you today.   LAB TODAY BMET  YOU HAVE A FOLLOW UP APPT WITH DR. Eden Emms @ 10:15 ON 06/26/12  FASTING LIPID AND LIVER PANEL THE SAME DAY YOU SEE DR. NISHAN 06/26/12  OK TO DRIVE PER SCOTT WEAVER, PAC  YOU HAVE BEEN GIVEN A WORK NOTE

## 2012-05-10 NOTE — Progress Notes (Signed)
  Subjective:    Patient ID: Jermaine Thompson, male    DOB: 17-Dec-1965, 46 y.o.   MRN: [id number]  HPI He is here for recheck. He recently had stenting done. He states that since then he has more energy than he has ever had. He has a very healthy attitude towards this. He is ready to go back to work. He is scheduled to see the cardiologist later today. He continues to smoke but is now down to 6 cigarettes per day. He plans to continue to taper. His wife is also quitting smoking   Review of Systems     Objective:   Physical Exam Alert and in no distress otherwise not examined       Assessment & Plan:   1. Coronary Artery Disease   2. Tobacco abuse    he seems to have a good handle on his condition with a very positive attitude. He will continue to work on smoking cessation. He has trouble, he is to call me.

## 2012-05-10 NOTE — Telephone Encounter (Signed)
pt notified about lab results and to restrict fluid intake 50-60 oz per day, and to watch drinking beer as well since this Na as well. pt verbalized understanding today, bmet 11/29

## 2012-05-19 ENCOUNTER — Ambulatory Visit (INDEPENDENT_AMBULATORY_CARE_PROVIDER_SITE_OTHER): Payer: BC Managed Care – PPO | Admitting: *Deleted

## 2012-05-19 DIAGNOSIS — I2589 Other forms of chronic ischemic heart disease: Secondary | ICD-10-CM

## 2012-05-19 LAB — BASIC METABOLIC PANEL
BUN: 9 mg/dL (ref 6–23)
Creat: 0.94 mg/dL (ref 0.50–1.35)
Glucose, Bld: 86 mg/dL (ref 70–99)

## 2012-05-25 ENCOUNTER — Encounter: Payer: BC Managed Care – PPO | Admitting: Cardiovascular Disease

## 2012-06-01 ENCOUNTER — Telehealth: Payer: Self-pay | Admitting: Cardiovascular Disease

## 2012-06-01 MED ORDER — LOSARTAN POTASSIUM 25 MG PO TABS: 25.0000 mg | ORAL_TABLET | Freq: Every day | ORAL | Status: DC

## 2012-06-01 NOTE — Telephone Encounter (Signed)
PER DR NISHAN   STOP LISINOPRIL AND START  LOSARTAN 25 MG  EVERY DAY  PT AWARE./CY

## 2012-06-01 NOTE — Telephone Encounter (Signed)
New Problem:    Patient called in wanting to know if there was an alternative medication for his lisinopril (ZESTRIL) 2.5 MG tablet because he has developed a persistent dry cough.  Please call back.

## 2012-06-20 ENCOUNTER — Ambulatory Visit (INDEPENDENT_AMBULATORY_CARE_PROVIDER_SITE_OTHER): Payer: 59 | Admitting: Medical

## 2012-06-20 ENCOUNTER — Encounter: Payer: Self-pay | Admitting: Medical

## 2012-06-20 VITALS — BP 120/78 | HR 66 | Temp 97.7°F | Resp 16 | Wt 165.0 lb

## 2012-06-20 DIAGNOSIS — Z87891 Personal history of nicotine dependence: Secondary | ICD-10-CM

## 2012-06-20 DIAGNOSIS — G4700 Insomnia, unspecified: Secondary | ICD-10-CM

## 2012-06-20 DIAGNOSIS — K120 Recurrent oral aphthae: Secondary | ICD-10-CM

## 2012-06-20 DIAGNOSIS — T7840XA Allergy, unspecified, initial encounter: Secondary | ICD-10-CM

## 2012-06-20 DIAGNOSIS — I2510 Atherosclerotic heart disease of native coronary artery without angina pectoris: Secondary | ICD-10-CM

## 2012-06-20 MED ORDER — ALPRAZOLAM 0.5 MG PO TABS: 0.5000 mg | ORAL_TABLET | Freq: Every evening | ORAL | Status: DC | PRN

## 2012-06-20 MED ORDER — TRIAMCINOLONE ACETONIDE 0.1 % MT PSTE: PASTE | Freq: Two times a day (BID) | OROMUCOSAL | Status: DC

## 2012-06-20 NOTE — Progress Notes (Signed)
Subjective: Here for questions and canker sores in mouth.  Normally sees Dr. Susann Givens here.  His last visit here was eventful - was sent to the ED, was ultimately found to have an MI, had stenting, and is now on numerous medications.   December alone was stressful - between new diagnosis of MI, stopping tobacco 5 weeks ago, Christmas holiday stress and his daughter's miscarriage, he has been through a lot. He is still adjusting to all the new medication.  He is taking Wellbutrin but still has cravings for tobacco, but he has remained tobacco free despite formerly over 2ppd habit.    His main c/o today is canker sores, 3 of them in his mouth.  He notes hx/o of these as a child, but these have been there about a week and won't go away.   He denies any other current symptoms.  otherwise feels fine.  He is using some salt water gargles.   With the tobacco cravings and dealing with the new medications having trouble sleeping.    Past Medical History  Diagnosis Date  . CAD in native artery     a. MV11/2013: low risk but with prior inf-basal MI with no ischemia, EF 47%.;  b. LHC 11/13: mRCA 99%, EF 50% with Inf HK => c. PCI:  Xience DES to mRCA    . History of blood transfusion 1980's  . Arthritis     "right knee" (04/28/2012)  . Gout     "once; long time ago" (04/28/2012)  . Tobacco abuse   . HLD (hyperlipidemia)   . Erythrocytosis     ? due to smoking  . Ischemic cardiomyopathy     EF 47% by nuclear and 50% by cath 04/2012   Review of Systems Constitutional: -fever, -chills, -sweats ENT: -runny nose, -ear pain, -sore throat Cardiology:  -chest pain, -palpitations, -edema Respiratory: -cough, -shortness of breath, -wheezing Gastroenterology: -abdominal pain, -nausea, -vomiting, -diarrhea, -constipation  Musculoskeletal: -arthralgias, -myalgias, -joint swelling, -back pain Ophthalmology: -vision changes Urology: -dysuria, -difficulty urinating, -hematuria, -urinary frequency, -urgency Neurology:  -headache, -weakness, -tingling, -numbness     Objective:   Physical Exam  Filed Vitals:   06/20/12 0951  BP: 120/78  Pulse: 66  Temp: 97.7 F (36.5 C)  Resp: 16    General appearance: alert, no distress, WD/WN Skin: 4 round erythematous patches in locations where he used the nicotine patches recently HEENT: normocephalic, sclerae anicteric, TMs pearly, nares patent, no discharge or erythema, pharynx normal Oral cavity: 3 separate whitish round ulcerations c/w aphthous ulcers, one beneath tongue, two on anterior upper gum, otherwise  MMM, no lesions Neck: supple, no lymphadenopathy, no thyromegaly, no masses Heart: RRR, normal S1, S2, no murmurs Lungs: CTA bilaterally, no wheezes, rhonchi, or rales  Pulses: 2+ symmetric   Assessment and Plan :     Encounter Diagnoses  Name Primary?  . Canker sores oral Yes  . Insomnia   . Former smoker   . CAD (coronary artery disease)   . Allergic reaction caused by a drug    Canker sores - likely triggered by stress. Script for triamcinolone in orabase, c/t salt water rinses.   If not improving in 1 wk let me know  Insomnia - short term script for xanax prn  Former smoker - congratulated him on quitting, and encouraged him on remaining quit.  C/t Wellbutrin for now.  CAD - seems to be tolerating new medication regimen.  F/u with cardiology as planned  Allergic reaction to nicotine patches -  topical dermatitis.  Allergies recorded in chart  Follow-up prn

## 2012-06-26 ENCOUNTER — Ambulatory Visit (INDEPENDENT_AMBULATORY_CARE_PROVIDER_SITE_OTHER): Payer: 59 | Admitting: Cardiovascular Disease

## 2012-06-26 ENCOUNTER — Encounter: Payer: Self-pay | Admitting: Cardiovascular Disease

## 2012-06-26 VITALS — BP 139/96 | HR 73 | Ht 72.0 in | Wt 165.8 lb

## 2012-06-26 DIAGNOSIS — E785 Hyperlipidemia, unspecified: Secondary | ICD-10-CM

## 2012-06-26 DIAGNOSIS — D751 Secondary polycythemia: Secondary | ICD-10-CM

## 2012-06-26 DIAGNOSIS — F17200 Nicotine dependence, unspecified, uncomplicated: Secondary | ICD-10-CM

## 2012-06-26 DIAGNOSIS — I2510 Atherosclerotic heart disease of native coronary artery without angina pectoris: Secondary | ICD-10-CM

## 2012-06-26 DIAGNOSIS — Z720 Tobacco use: Secondary | ICD-10-CM

## 2012-06-26 MED ORDER — METOPROLOL TARTRATE 25 MG PO TABS: 25.0000 mg | ORAL_TABLET | Freq: Two times a day (BID) | ORAL | Status: DC

## 2012-06-26 MED ORDER — BUPROPION HCL ER (SR) 150 MG PO TB12: 150.0000 mg | ORAL_TABLET | Freq: Two times a day (BID) | ORAL | Status: DC

## 2012-06-26 MED ORDER — PANTOPRAZOLE SODIUM 40 MG PO TBEC: 40.0000 mg | DELAYED_RELEASE_TABLET | Freq: Every day | ORAL | Status: DC

## 2012-06-26 MED ORDER — PRASUGREL HCL 10 MG PO TABS: 10.0000 mg | ORAL_TABLET | Freq: Every day | ORAL | Status: DC

## 2012-06-26 MED ORDER — LOSARTAN POTASSIUM 25 MG PO TABS: 25.0000 mg | ORAL_TABLET | Freq: Every day | ORAL | Status: DC

## 2012-06-26 MED ORDER — ATORVASTATIN CALCIUM 80 MG PO TABS: 80.0000 mg | ORAL_TABLET | Freq: Every day | ORAL | Status: DC

## 2012-06-26 NOTE — Assessment & Plan Note (Signed)
Congratulated him on quitting and need to keep up with this to prevent further CAD

## 2012-06-26 NOTE — Assessment & Plan Note (Signed)
F/U labs next visit since he is not smoking  Hb 17.1 a month ago  If still high may need RBC mass

## 2012-06-26 NOTE — Patient Instructions (Addendum)
Your physician recommends that you continue on your current medications as directed. Please refer to the Current Medication list given to you today.  Your physician recommends that you return for lab work in: 3 months (same day as next office visit) Do not eat or drink after midnight the night before    Your physician recommends that you schedule a follow-up appointment in: 3 months

## 2012-06-26 NOTE — Progress Notes (Signed)
Patient ID: Jermaine Thompson, male   DOB: June 26, 1965, 47 y.o.   MRN: [id number] Jermaine Thompson is a 47 y.o. male who returns for follow up after admission 11/13  to the hospital for unstable angina.  He has a history of significant tobacco abuse. He was admitted a few weeks ago with chest pain. MI was ruled out. Outpatient Myoview demonstrated EF of 47% with inferior hypokinesis consistent with prior scar but no ischemia. He returned to the ED with recurrent chest discomfort and syncope. Serial cardiac markers were normal. 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 This was treated with a Xience DES. Dual antiplatelet therapy was recommended for one year. Patient had no arrhythmias on telemetry. Since d/c, he is doing well. The patient denies chest pain, shortness of breath, syncope, orthopnea, PND or significant pedal edema.   Quit smoking for the last 6 weeks.  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  All other systems reviewed and negative  General: Affect appropriate Healthy:  appears stated age HEENT: normal Neck supple with no adenopathy JVP normal no bruits no thyromegaly Lungs clear with no wheezing and good diaphragmatic motion Heart:  S1/S2 no murmur, no rub, gallop or click PMI normal Abdomen: benighn, BS positve, no tenderness, no AAA no bruit.  No HSM or HJR Distal pulses intact with no bruits No edema Neuro non-focal Skin warm and dry No muscular weakness   Current Outpatient Prescriptions  Medication Sig Dispense Refill  . ALPRAZolam (XANAX) 0.5 MG tablet Take 1 tablet (0.5 mg total) by mouth at bedtime as needed for sleep.  30 tablet  0  . aspirin EC 81 MG EC tablet Take 1  tablet (81 mg total) by mouth daily.      Marland Kitchen atorvastatin (LIPITOR) 80 MG tablet Take 1 tablet (80 mg total) by mouth daily at 6 PM.  30 tablet  5  . buPROPion (WELLBUTRIN SR) 150 MG 12 hr tablet Take 1 tablet (150 mg total) by mouth 2 (two) times daily.  60 tablet  3  . losartan (COZAAR) 25 MG tablet Take 1 tablet (25 mg total) by mouth daily.  30 tablet  2  . metoprolol tartrate (LOPRESSOR) 25 MG tablet Take 1 tablet (25 mg total) by mouth 2 (two) times daily.  60 tablet  11  . nitroGLYCERIN (NITROSTAT) 0.4 MG SL tablet Place 1 tablet (0.4 mg total) under the tongue every 5 (five) minutes x 3 doses as needed for chest pain.  25 tablet  3  . pantoprazole (PROTONIX) 40 MG tablet Take 1 tablet (40 mg total) by mouth daily.  30 tablet  3  . prasugrel (EFFIENT) 10 MG TABS Take 1 tablet (10 mg total) by mouth daily.  30 tablet  11  . triamcinolone (KENALOG) 0.1 % paste Place onto teeth 2 (two) times daily. Kenalog in orabase  5 g  1    Allergies  Nicotine  Electrocardiogram:  05/07/12  SR rate 91 pulmonary disease pattern  Assessment and Plan

## 2012-06-26 NOTE — Assessment & Plan Note (Signed)
Stable with no angina and good activity level.  Continue medical Rx  

## 2012-06-26 NOTE — Addendum Note (Signed)
**Note De-Identified Creighton Longley Obfuscation** Addended by: Demetrios Loll on: 06/26/2012 10:47 AM   Modules accepted: Orders

## 2012-08-05 ENCOUNTER — Other Ambulatory Visit: Payer: Self-pay

## 2012-08-15 ENCOUNTER — Other Ambulatory Visit: Payer: Self-pay | Admitting: Medical

## 2012-08-15 MED ORDER — ALPRAZOLAM 0.5 MG PO TABS: 0.5000 mg | ORAL_TABLET | Freq: Every evening | ORAL | Status: DC | PRN

## 2012-08-15 NOTE — Telephone Encounter (Signed)
Call out xanax 

## 2012-08-16 NOTE — Telephone Encounter (Signed)
I called out the patients RX to his pahrmacy per Crosby Oyster PA-C. CLS

## 2012-08-17 ENCOUNTER — Other Ambulatory Visit (HOSPITAL_COMMUNITY): Payer: Self-pay | Admitting: Physician Assistant

## 2012-08-26 ENCOUNTER — Other Ambulatory Visit (HOSPITAL_COMMUNITY): Payer: Self-pay | Admitting: Physician Assistant

## 2012-09-13 ENCOUNTER — Telehealth: Payer: Self-pay | Admitting: Cardiovascular Disease

## 2012-09-13 ENCOUNTER — Telehealth: Payer: Self-pay | Admitting: Family Medicine

## 2012-09-13 NOTE — Telephone Encounter (Signed)
Pt's wife called and stated that pt was experiencing jaw pain. Pt has a history of cardiac issues. Pt's wife was informed to due to the fact that he has a cardiologist to call them and inform of situation. Pt's wife was told to call as soon as we hung up if any other symtoms go to er.

## 2012-09-13 NOTE — Telephone Encounter (Signed)
Pt calls today b/c he is having right jaw pain. It has been going on all morning. Yesterday it hurt but went away.  States he has had a sinus headache and is taking ?medication to reduce the drainage. States this is not typical angina for him but he was told in the hospital that jaw pain might be angina.  Pt has not taken any NTG.  He is at work and will take a NTG to see if this does relieve the jaw pain & call back to the office He has follow-up appt with Dr. Eden Emms on 09/15/12. Mylo Red RN

## 2012-09-13 NOTE — Telephone Encounter (Signed)
F/u ° ° °Pt returning your call °

## 2012-09-13 NOTE — Telephone Encounter (Signed)
Pt returns call & states NTG did not make any difference with the jaw discomfort. He still feels this is from his sinuses. He feels this is not angina and is comfortable with appt on Friday with Dr. Lynnette Caffey RN

## 2012-09-13 NOTE — Telephone Encounter (Signed)
New Prob   Experiencing pain in his jaw on the right side. Started with sinus headaches and the pain went to the jaw. Called PCP first, was advised to contact cardiology first. Would like to speak to nurse.

## 2012-09-14 ENCOUNTER — Encounter: Payer: Self-pay | Admitting: Family Medicine

## 2012-09-14 ENCOUNTER — Ambulatory Visit (INDEPENDENT_AMBULATORY_CARE_PROVIDER_SITE_OTHER): Payer: 59 | Admitting: Family Medicine

## 2012-09-14 VITALS — BP 120/80 | HR 74 | Temp 97.8°F | Wt 166.0 lb

## 2012-09-14 DIAGNOSIS — J309 Allergic rhinitis, unspecified: Secondary | ICD-10-CM

## 2012-09-14 DIAGNOSIS — J019 Acute sinusitis, unspecified: Secondary | ICD-10-CM

## 2012-09-14 DIAGNOSIS — I2510 Atherosclerotic heart disease of native coronary artery without angina pectoris: Secondary | ICD-10-CM

## 2012-09-14 DIAGNOSIS — Z79899 Other long term (current) drug therapy: Secondary | ICD-10-CM

## 2012-09-14 DIAGNOSIS — J302 Other seasonal allergic rhinitis: Secondary | ICD-10-CM

## 2012-09-14 DIAGNOSIS — E878 Other disorders of electrolyte and fluid balance, not elsewhere classified: Secondary | ICD-10-CM

## 2012-09-14 LAB — COMPREHENSIVE METABOLIC PANEL
AST: 41 U/L — ABNORMAL HIGH (ref 0–37)
Albumin: 3.9 g/dL (ref 3.5–5.2)
Alkaline Phosphatase: 125 U/L — ABNORMAL HIGH (ref 39–117)
BUN: 9 mg/dL (ref 6–23)
Calcium: 9.3 mg/dL (ref 8.4–10.5)
Chloride: 102 mEq/L (ref 96–112)
Glucose, Bld: 87 mg/dL (ref 70–99)
Potassium: 4.7 mEq/L (ref 3.5–5.3)
Sodium: 137 mEq/L (ref 135–145)
Total Protein: 7 g/dL (ref 6.0–8.3)

## 2012-09-14 LAB — LIPID PANEL
LDL Cholesterol: 27 mg/dL (ref 0–99)
VLDL: 33 mg/dL (ref 0–40)

## 2012-09-14 MED ORDER — AMOXICILLIN 875 MG PO TABS: 875.0000 mg | ORAL_TABLET | Freq: Two times a day (BID) | ORAL | Status: DC

## 2012-09-14 NOTE — Progress Notes (Signed)
  Subjective:    Patient ID: Jermaine Thompson, male    DOB: 06/04/1966, 47 y.o.   MRN: [id number]  HPI He has a ten-day history of sore throat, sinus congestion, dry cough and rhinorrhea. He now has a productive cough worsening sinus pressure especially right maxillary sinus. She complains of headache and light sensitivity.He has no fever, chills, PND or ear symptoms.has tried OTC medications without much success. He does have a history of seasonal allergies and usually twice per year gets sinusitis. He quit smoking in November of last year. Works in [employer]. He has a previous history of cardiac disease with stenting. Review of his record indicates abnormal electrolytes. He is scheduled to see his cardiologist tomorrow. He continues on medications listed in the chart.   Review of Systems     Objective:   Physical Exam alert and in no distress. Tympanic membranes and canals are normal. Throat is clear. Tonsils are normal. Neck is supple without adenopathy or thyromegaly. Cardiac exam shows a regular sinus rhythm without murmurs or gallops. Lungs are clear to auscultation. Nasal mucosa is slightly red with tenderness over frontal sinuses.        Assessment & Plan:  Allergic rhinitis, seasonal  Disorder of electrolytes  Coronary Artery Disease - Plan: Lipid panel, CBC with Differential, Comprehensive metabolic panel  Acute sinusitis - Plan: amoxicillin (AMOXIL) 875 MG tablet  Encounter for long-term (current) use of other medications - Plan: Lipid panel, CBC with Differential, Comprehensive metabolic panel he will followup with his cardiologist tomorrow. He is to call me if not entirely better when he finishes the course of the antibiotic.

## 2012-09-14 NOTE — Patient Instructions (Signed)
Take all the antibiotic and if not totally back to normal when you finish them ,give me a call.

## 2012-09-15 ENCOUNTER — Ambulatory Visit (INDEPENDENT_AMBULATORY_CARE_PROVIDER_SITE_OTHER): Payer: 59 | Admitting: Cardiovascular Disease

## 2012-09-15 ENCOUNTER — Other Ambulatory Visit (INDEPENDENT_AMBULATORY_CARE_PROVIDER_SITE_OTHER): Payer: 59

## 2012-09-15 ENCOUNTER — Encounter: Payer: Self-pay | Admitting: Cardiovascular Disease

## 2012-09-15 ENCOUNTER — Other Ambulatory Visit: Payer: Self-pay | Admitting: *Deleted

## 2012-09-15 VITALS — BP 143/78 | HR 70 | Ht 72.0 in | Wt 169.0 lb

## 2012-09-15 DIAGNOSIS — R0989 Other specified symptoms and signs involving the circulatory and respiratory systems: Secondary | ICD-10-CM

## 2012-09-15 DIAGNOSIS — E785 Hyperlipidemia, unspecified: Secondary | ICD-10-CM | POA: Insufficient documentation

## 2012-09-15 DIAGNOSIS — R748 Abnormal levels of other serum enzymes: Secondary | ICD-10-CM

## 2012-09-15 DIAGNOSIS — I2510 Atherosclerotic heart disease of native coronary artery without angina pectoris: Secondary | ICD-10-CM

## 2012-09-15 LAB — CBC WITH DIFFERENTIAL/PLATELET
HCT: 41.2 % (ref 39.0–52.0)
Hemoglobin: 14 g/dL (ref 13.0–17.0)
Lymphocytes Relative: 37 % (ref 12–46)
Monocytes Absolute: 0.7 10*3/uL (ref 0.1–1.0)
Monocytes Relative: 6 % (ref 3–12)
Neutro Abs: 5.9 10*3/uL (ref 1.7–7.7)
Neutrophils Relative %: 55 % (ref 43–77)
RBC: 4.38 MIL/uL (ref 4.22–5.81)
WBC: 10.7 10*3/uL — ABNORMAL HIGH (ref 4.0–10.5)

## 2012-09-15 MED ORDER — ATORVASTATIN CALCIUM 40 MG PO TABS: 40.0000 mg | ORAL_TABLET | Freq: Every day | ORAL | Status: DC

## 2012-09-15 NOTE — Assessment & Plan Note (Signed)
Improved.  Stable with no angina and good activity level.  Continue medical Rx DAT continue stent to RCA

## 2012-09-15 NOTE — Progress Notes (Signed)
Patient ID: Jermaine Thompson, male   DOB: 1965/11/04, 47 y.o.   MRN: [id number] Jermaine Thompson is a 47 y.o. male who returns for follow up after admission 11/13 to the hospital for unstable angina.  He has a history of significant tobacco abuse. He was admitted a few weeks ago with chest pain. MI was ruled out. Outpatient Myoview demonstrated EF of 47% with inferior hypokinesis consistent with prior scar but no ischemia. He returned to the ED with recurrent chest discomfort and syncope. Serial cardiac markers were normal. 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 This was treated with a Xience DES. Dual antiplatelet therapy was recommended for one year. Patient had no arrhythmias on telemetry. Since d/c, he is doing well. The patient denies chest pain, shortness of breath, syncope, orthopnea, PND or significant pedal edema.  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  All other systems reviewed and negative  General: Affect appropriate Healthy:  appears stated age HEENT: normal Neck supple with no adenopathy JVP normal no bruits no thyromegaly Lungs clear with no wheezing and good diaphragmatic motion Heart:  S1/S2 no murmur, no rub, gallop or click PMI normal Abdomen: benighn, BS positve, no tenderness, no AAA no bruit.  No HSM or HJR Distal pulses intact with no bruits No edema Neuro non-focal Skin warm and dry No muscular weakness   Current Outpatient Prescriptions  Medication Sig Dispense Refill  . ALPRAZolam (XANAX) 0.5 MG tablet Take 1 tablet (0.5 mg total) by mouth at bedtime as needed for sleep.  30 tablet  0  . amoxicillin (AMOXIL) 875 MG tablet Take 1  tablet (875 mg total) by mouth 2 (two) times daily.  20 tablet  0  . aspirin EC 81 MG EC tablet Take 1 tablet (81 mg total) by mouth daily.      Marland Kitchen atorvastatin (LIPITOR) 80 MG tablet Take 1 tablet (80 mg total) by mouth daily at 6 PM.  90 tablet  3  . buPROPion (WELLBUTRIN SR) 150 MG 12 hr tablet Take 1 tablet (150 mg total) by mouth 2 (two) times daily.  180 tablet  3  . losartan (COZAAR) 25 MG tablet Take 1 tablet (25 mg total) by mouth daily.  90 tablet  3  . metoprolol tartrate (LOPRESSOR) 25 MG tablet Take 1 tablet (25 mg total) by mouth 2 (two) times daily.  180 tablet  3  . nitroGLYCERIN (NITROSTAT) 0.4 MG SL tablet Place 1 tablet (0.4 mg total) under the tongue every 5 (five) minutes x 3 doses as needed for chest pain.  25 tablet  3  . pantoprazole (PROTONIX) 40 MG tablet Take 1 tablet (40 mg total) by mouth daily.  90 tablet  3  . prasugrel (EFFIENT) 10 MG TABS Take 1 tablet (10 mg total) by mouth daily.  90 tablet  3   No current facility-administered medications for this visit.    Allergies  Nicotine  Electrocardiogram:  11/17  NSR rate 98 otherwise normal  Assessment and Plan

## 2012-09-15 NOTE — Addendum Note (Signed)
Addended by: Scherrie Bateman E on: 09/15/2012 10:12 AM   Modules accepted: Orders

## 2012-09-15 NOTE — Progress Notes (Signed)
Quick Note:  Labs overall of good however one of his liver studies is elevated. Have him return here in 2-4 weeks for recheck on that. ______

## 2012-09-15 NOTE — Assessment & Plan Note (Signed)
LDL yesterday only 27  Decrease lipitor to 40 mg to avoid long term side effects

## 2012-09-15 NOTE — Patient Instructions (Signed)
Your physician wants you to follow-up in: YEAR WITH DR Haywood Filler will receive a reminder letter in the mail two months in advance. If you don't receive a letter, please call our office to schedule the follow-up appointment.  Your physician has recommended you make the following change in your medication: DECREASE ATORVASTATIN  TO   40  MG

## 2012-11-28 ENCOUNTER — Telehealth (INDEPENDENT_AMBULATORY_CARE_PROVIDER_SITE_OTHER): Payer: Self-pay | Admitting: *Deleted

## 2012-11-28 ENCOUNTER — Ambulatory Visit (INDEPENDENT_AMBULATORY_CARE_PROVIDER_SITE_OTHER): Payer: 59 | Admitting: Family Medicine

## 2012-11-28 ENCOUNTER — Encounter: Payer: Self-pay | Admitting: Family Medicine

## 2012-11-28 VITALS — BP 150/80 | HR 100 | Wt 176.0 lb

## 2012-11-28 DIAGNOSIS — L723 Sebaceous cyst: Secondary | ICD-10-CM

## 2012-11-28 DIAGNOSIS — L570 Actinic keratosis: Secondary | ICD-10-CM

## 2012-11-28 DIAGNOSIS — L989 Disorder of the skin and subcutaneous tissue, unspecified: Secondary | ICD-10-CM

## 2012-11-28 DIAGNOSIS — R208 Other disturbances of skin sensation: Secondary | ICD-10-CM

## 2012-11-28 DIAGNOSIS — R209 Unspecified disturbances of skin sensation: Secondary | ICD-10-CM

## 2012-11-28 NOTE — Telephone Encounter (Signed)
Received a call from a staff member at Dr. Jola Babinski office requesting an appt for patient to be seen for an unknown area on the patient's lower extremity.  It was originally told to this RN that patient has an area on the lower extremity that any time it is touched begins to bleed and it is too large for them to do anything along with the issue that the patient is on a blood thinner.  Explained that if patient is on a blood thinner then nothing would be done in the office until patient is able to be off blood thinner for at least 5 days or it should be seen in the ED.  They then placed this RN on hold and came back to state that patient is not on blood thinners any longer.  This RN tried to explain multiple times that this sounds like something that needs to be seen in the ED due to it being unknown what this area actually is and if the size is too large this office is unable to treat due to not enough medication resources.  Appt was made in urgent office for tomorrow but again explained that patient would most likely have to be sent to ED if in fact what was explained to this RN was true.  The response was "that would have to be you alls choice".

## 2012-11-28 NOTE — Progress Notes (Signed)
  Subjective:    Patient ID: Jermaine Thompson, male    DOB: April 14, 1966, 47 y.o.   MRN: [id number]  HPI He is here for consult concerning multiple issues. He does have a lesion on his upper mid back area that he thinks is slightly growing. He does complain of difficulty with tingling sensation in the index and large finger especially when he just for an object and bends forward. It does not tend to bother him when he works. He does use his hands as part of his job regularly. He did have surgery performed on a lesion on his he and that continues to drain intermittently. He also has lesions present on the left cheek that in the past he did have frozen however they have continued to give him difficulty. He also has a rather large lesion that he is noted on his left leg.   Review of Systems     Objective:   Physical Exam Exam of the hands does show a lesion present over the third finger dorsal surface PIP joint it appears like a dimple. He does have a 3 cm lesion on the midportion of his upper back it is a smooth round movable with a small dimple.exam of his face does show 2 lesions present on the left cheek near the side burns that are dry and scaly with some coloration. Exam of the calf area does show a 2-3 cm raised pinkish lesion with some evidence of recent trauma.      Assessment & Plan:  Sebaceous cyst  Dysesthesia  Lesion of lower extremity - Plan: Ambulatory referral to General Surgery  Actinic keratosis of left cheek - Plan: Ambulatory referral to Dermatology discuss treatment of a cyst on his back and recommend he return when it gives him more trouble. Explained that this did not have to be removed. Discussed the hand symptoms and told him to pay more attention to wrist and shoulder position as well as elbow position to see if this gives a better feel for exactly what causing the tingling sensation in his hand. Refer to dermatology for the lesion on his cheek. Lesion on his calf does  appear to be possibly cancerous and therefore general surgery will be called. It does appear that he probably has a sebaceous cyst in his hand and again discussed the fact that if this gives him more trouble, further evaluation will be necessary. Apparently this was originally taking care of in an Urgent Care Center.

## 2012-11-29 ENCOUNTER — Encounter (INDEPENDENT_AMBULATORY_CARE_PROVIDER_SITE_OTHER): Payer: Self-pay

## 2012-11-29 ENCOUNTER — Encounter (INDEPENDENT_AMBULATORY_CARE_PROVIDER_SITE_OTHER): Payer: Self-pay | Admitting: General Surgery

## 2012-11-29 ENCOUNTER — Ambulatory Visit (INDEPENDENT_AMBULATORY_CARE_PROVIDER_SITE_OTHER): Payer: 59 | Admitting: General Surgery

## 2012-11-29 VITALS — BP 170/90 | HR 82 | Temp 97.2°F | Resp 18 | Ht 72.0 in | Wt 176.4 lb

## 2012-11-29 DIAGNOSIS — L989 Disorder of the skin and subcutaneous tissue, unspecified: Secondary | ICD-10-CM

## 2012-11-29 NOTE — Patient Instructions (Signed)
Needs wide excision left calf lesion

## 2012-12-06 ENCOUNTER — Telehealth: Payer: Self-pay | Admitting: Cardiovascular Disease

## 2012-12-06 NOTE — Telephone Encounter (Signed)
New problem  Pt's wife said he is suppose to have surgery for a skin lesion. She said Dr Carolynne Edouard told them they would contact us regarding getting approval from Dr Eden Emms. She said she called their office today and they advised her that they still have not heard anything from [place]. She wants to speak with someone regarding this.

## 2012-12-07 NOTE — Telephone Encounter (Signed)
Follow up  Wife is calling back to see if the doctor has spoken with Dr Carolynne Edouard.

## 2012-12-08 ENCOUNTER — Encounter: Payer: Self-pay | Admitting: Cardiovascular Disease

## 2012-12-08 ENCOUNTER — Telehealth (INDEPENDENT_AMBULATORY_CARE_PROVIDER_SITE_OTHER): Payer: Self-pay

## 2012-12-08 NOTE — Telephone Encounter (Signed)
Pt's wife concerned they have not heard from our office regarding scheduling surgery.  Dr. Fabio Bering note dated 6/18 in Epic.

## 2012-12-08 NOTE — Telephone Encounter (Signed)
Pt's MRI scheduled for 12/12/12.  She is faxing request for insurance pre-certification today.

## 2012-12-08 NOTE — Telephone Encounter (Signed)
LMTCB            [place], Monterio Bob - 11/29/12 ','<More Detail >>       Barrie Folk, RN       Sent: Caleen Essex December 08, 2012 12:17 PM    To: Alois Cliche, LPN                   Message             ----- Message -----    From: Wendall Stade, MD    Sent: 12/06/2012 3:09 PM    To: Robyne Askew, MD, Barrie Folk, RN        Patient had drug eluting stent to RCA in November and should not have aspirin or effient stopped for a year. If excision can be done on antiplatlet Rx that would be better. Will need to discuss with me risk of cancer or other to stop effient     ----- Message -----    From: Barrie Folk, RN    Sent: 12/06/2012 10:39 AM    To: Wendall Stade, MD                            Anna Genre Fogarty Description: 48 year old male  11/29/2012 Letter (Out) Provider: Brennan Bailey, CMA  MRN: [id number] Department: [facility]            Letters    Letter Information      Status    Brennan Bailey on 11/29/2012 Sent          Notes    No notes of this type exist for this encounter.

## 2012-12-08 NOTE — Progress Notes (Signed)
Patient ID: Jermaine Thompson, male   DOB: March 20, 1966, 47 y.o.   MRN: [id number] Patient and wife spoken to on phone He has a lesion on his right calf that has been there for a year but ? Growing Dr Susann Givens concerned about melanoma.  No biopsy has been done I think patient should be Rx by [facility] surgeon  Called Dr Albertini's office who took his info and will arrange f/u If risk of melanoma is high can stop Effient and ASA 5-7 days before excision He has past the 6 month mark for his DES He understands there is a slight risk to stopping DAT before a year but this  Would be considered in the face of possible rapidly expanding skin cancer  [name]

## 2012-12-08 NOTE — Telephone Encounter (Signed)
SPOKE WITH PT'S WIFE RE MESSAGE  WHILE SPEAKING DR Eden Emms ALSO   SPOKE WITH  WIFE AND PT  ALSO  CALLED DR TOTH'S OFFICE  MD IS ON  VACATION AND HAS NOT  REVIEWED MESSAGE THAT WAS SENT FROM DR Eden Emms AT THIS TIME .Jermaine Thompson

## 2012-12-08 NOTE — Telephone Encounter (Signed)
Disregard this message.  Mistakenly entered in wrong patient's chart.

## 2012-12-11 ENCOUNTER — Other Ambulatory Visit (INDEPENDENT_AMBULATORY_CARE_PROVIDER_SITE_OTHER): Payer: Self-pay | Admitting: General Surgery

## 2012-12-11 DIAGNOSIS — L989 Disorder of the skin and subcutaneous tissue, unspecified: Secondary | ICD-10-CM | POA: Insufficient documentation

## 2012-12-11 NOTE — Progress Notes (Signed)
Subjective:     Patient ID: Jermaine Thompson, male   DOB: 04/16/1966, 47 y.o.   MRN: [id number]  HPI We're asked to see the patient in consultation by Dr. Susann Givens to evaluate him for a lesion on his left calf. The patient is a 47 year old white male who has noticed a growth on his left calf for the last few weeks. He does not recall any trauma to the area. He does note that the lesion is located at the top edge of his boots which do rub in this location. He has had no fever or drainage. He does feel as though the mass is getting larger. He does have a history of coronary artery disease and had a drug eluding stent placed about 6 months ago. He is on Effiant and an aspirin for this.  Review of Systems  Constitutional: Negative.   HENT: Negative.   Eyes: Negative.   Respiratory: Negative.   Cardiovascular: Negative.   Gastrointestinal: Negative.   Endocrine: Negative.   Genitourinary: Negative.   Musculoskeletal: Negative.   Skin: Positive for wound.  Allergic/Immunologic: Negative.   Neurological: Negative.   Hematological: Negative.   Psychiatric/Behavioral: Negative.        Objective:   Physical Exam  Constitutional: He is oriented to person, place, and time. He appears well-developed and well-nourished.  HENT:  Head: Normocephalic and atraumatic.  Eyes: Conjunctivae and EOM are normal. Pupils are equal, round, and reactive to light.  Neck: Normal range of motion. Neck supple.  Cardiovascular: Normal rate, regular rhythm and normal heart sounds.   Pulmonary/Chest: Effort normal and breath sounds normal.  Abdominal: Soft. Bowel sounds are normal.  Musculoskeletal: Normal range of motion.  On the left mid calf there is a raised lesion about a centimeter and a half to 2 cm in diameter. It is mobile and seems to be isolated to the skin  Neurological: He is alert and oriented to person, place, and time.  Skin: Skin is warm and dry.  Psychiatric: He has a normal mood and affect. His  behavior is normal.       Assessment:     The patient has what appears to be a skin cancer on his left mid posterior calf. Because of its appearance I think it would be appropriate to remove this. I have discussed with him in detail the risks and benefits of the operation to do this as well as some of the technical aspects including the risk of bleeding given his use of blood thinners and he understands and wishes to proceed     Plan:     Given his cardiac history we will obtain cardiac clearance and given instructions on what to do with his blood thinners prior to scheduling surgery. He will need a wide excision of the lesion on his left posterior calf.

## 2012-12-11 NOTE — Telephone Encounter (Signed)
SPOKE WITH  PT'S WIFE HAS NOT HEARD  FROM DR ALBERTINI'S OFFICE  AS OF YET WILL CHECK AGAIN TOM .Jermaine Thompson

## 2012-12-12 ENCOUNTER — Telehealth (INDEPENDENT_AMBULATORY_CARE_PROVIDER_SITE_OTHER): Payer: Self-pay

## 2012-12-12 NOTE — Telephone Encounter (Signed)
PT'S WIFE AWARE   NO ONE CALLED FROM  [facility]  RE   HAVING PT HOLD ANY MEDS   APPT WAS MADE WITH  DR Judeth Cornfield [facility]   FOR 12-15-12 AT 4:00 PM FOR CONSULT.

## 2012-12-12 NOTE — Telephone Encounter (Signed)
New problem   Jermaine Thompson want to let Dr Eden Emms know they have set up an appt for pt on 12/15/12 at 4pm pt is aware.

## 2012-12-12 NOTE — Telephone Encounter (Signed)
I called patient to let him know after reviewing Dr Eden Emms office notes, Dr Carolynne Edouard wants him to stop the effient 3 days before surgery but can stay on his ASA. Told him our surgery schedulers will call him in the next 24-48 hrs to schedule surgery.

## 2012-12-12 NOTE — Telephone Encounter (Signed)
ATTEMPTED TO CALL UNABLE TO LEAVE MESSAGE .Jermaine Thompson

## 2012-12-12 NOTE — Telephone Encounter (Signed)
New Prob      Pt calling in regards to some questions she has regarding pt being told to come off of EFFIENT. Please call.

## 2013-04-22 ENCOUNTER — Other Ambulatory Visit (HOSPITAL_COMMUNITY): Payer: Self-pay | Admitting: Physician Assistant

## 2013-04-24 ENCOUNTER — Ambulatory Visit (INDEPENDENT_AMBULATORY_CARE_PROVIDER_SITE_OTHER): Payer: 59 | Admitting: Family Medicine

## 2013-04-24 ENCOUNTER — Encounter: Payer: Self-pay | Admitting: Family Medicine

## 2013-04-24 VITALS — BP 150/80 | HR 80 | Wt 178.0 lb

## 2013-04-24 DIAGNOSIS — L723 Sebaceous cyst: Secondary | ICD-10-CM

## 2013-04-24 DIAGNOSIS — F911 Conduct disorder, childhood-onset type: Secondary | ICD-10-CM

## 2013-04-24 DIAGNOSIS — R454 Irritability and anger: Secondary | ICD-10-CM

## 2013-04-24 DIAGNOSIS — L089 Local infection of the skin and subcutaneous tissue, unspecified: Secondary | ICD-10-CM

## 2013-04-24 DIAGNOSIS — I2510 Atherosclerotic heart disease of native coronary artery without angina pectoris: Secondary | ICD-10-CM

## 2013-04-24 DIAGNOSIS — R748 Abnormal levels of other serum enzymes: Secondary | ICD-10-CM

## 2013-04-24 NOTE — Patient Instructions (Addendum)
STOP All alcohol and Lipitor for the next month and they'll recheck your bloodwork. Called Darryl Hyers [phone] Stress without distress No more bites from the "stupid sandwich"!!!

## 2013-04-24 NOTE — Progress Notes (Signed)
  Subjective:    Patient ID: Jermaine Thompson, male    DOB: 1965-09-17, 47 y.o.   MRN: [id number]  HPI He is here for consultation after recent emergency room visit in [facility]. He had an episode of anger and after that developed weakness and sweating and was seen in the emergency room. They did evaluate him for heart disease since he has had previous heart attack. Review of the blood work did show elevated liver enzymes. He admits to having very rigid concerns over people doing things that he consider stupid and reacting very negatively towards him. He realizes that in the past he was using cigarettes and alcohol to diffuse the situation. He also has an infected sebaceous cyst on his upper back and apparently has drained on several occasions within the last week or 2.   Review of Systems     Objective:   Physical Exam Alert and in no distress. 3 cm round erythematous lesion noted on the upper mid back.       Assessment & Plan:  Abnormal liver enzymes  Anger reaction  Infected sebaceous cyst  Coronary Artery Disease  he is to return here tomorrow for I and D. He will stop all alcohol and his Lipitor for the next month. I will then recheck his numbers and hopefully start him back on his statin. Discussed anger, stress and stress management. They gave him some pointers on not allowing people's actions to control his emotions. I will refer him to [facility] for further counseling concerning anger management. Recheck his enzymes in one month.

## 2013-04-25 ENCOUNTER — Ambulatory Visit (INDEPENDENT_AMBULATORY_CARE_PROVIDER_SITE_OTHER): Payer: 59 | Admitting: Family Medicine

## 2013-04-25 DIAGNOSIS — L723 Sebaceous cyst: Secondary | ICD-10-CM

## 2013-04-25 DIAGNOSIS — G4700 Insomnia, unspecified: Secondary | ICD-10-CM

## 2013-04-25 DIAGNOSIS — L089 Local infection of the skin and subcutaneous tissue, unspecified: Secondary | ICD-10-CM

## 2013-04-25 MED ORDER — LIDOCAINE HCL 2 % IJ SOLN
3.0000 mL | Freq: Once | INTRAMUSCULAR | Status: AC
  Administered 2013-04-25: 60 mg via INTRADERMAL

## 2013-04-25 NOTE — Patient Instructions (Signed)

## 2013-04-25 NOTE — Progress Notes (Signed)
  Subjective:    Patient ID: Jermaine Thompson, male    DOB: November 19, 1965, 47 y.o.   MRN: [id number]  HPI He is here for I and D. of an infected sebaceous cyst in his upper back. He notes that it did drain on 2 separate occasions of purulent material. It has since reformed. He also complains of a long history of difficulty with insomnia. In the past he would use beer to help him sleep but he is now off alcohol due to elevated liver enzymes. He states he has trouble turning his brain off.   Review of Systems     Objective:   Physical Exam 3 cm erythematous lesion is noted on the upper mid back.        Assessment & Plan:  Infected sebaceous cyst  Insomnia  lesion was injected with Xylocaine. A 3 cm incision was made. No purulent material was noted nor cyst sac. Old blood was extruded from the lesion. The lesion was cleaned and packed with iodoform. He is to return here in 2 days for removal. I think he traumatized the lesion after it had spontaneously drained causing the blood. I then discussed insomnia with him. Gave him information concerning this. We will rediscuss this with his next visit in one month. He is also to followup with his therapist concerning this.

## 2013-04-27 ENCOUNTER — Ambulatory Visit (INDEPENDENT_AMBULATORY_CARE_PROVIDER_SITE_OTHER): Payer: 59 | Admitting: Medical

## 2013-04-27 ENCOUNTER — Encounter: Payer: Self-pay | Admitting: Medical

## 2013-04-27 VITALS — Wt 180.0 lb

## 2013-04-27 DIAGNOSIS — Z5189 Encounter for other specified aftercare: Secondary | ICD-10-CM

## 2013-04-27 MED ORDER — HYDROCODONE-ACETAMINOPHEN 5-325 MG PO TABS: 1.0000 | ORAL_TABLET | Freq: Four times a day (QID) | ORAL | Status: DC | PRN

## 2013-04-27 NOTE — Progress Notes (Signed)
Subjective: Here for packing removal.   Was seen here 2 days ago for I&D of sebaceous cyst/abscess of upper back.  Doing fine, area is sore and tender though.  Area has been draining a lot of pus.   Objective: Gen: wd, wn, nad  Skin: large raised area of upper middle back with mild erythema and central surgical incision ,c/w recent sebaceous cyst/abscess I&D, packing in place  Assessment: Encounter Diagnosis  Name Primary?  . Visit for wound check Yes    Plan:  Removed packing.   Cleaned and prepped area in usual sterile fashion.  Used 2 cc of 1% lidocaine without epi for local anesthesia.   Irrigated wound with high pressure saline . Repacked with 15cm of 1/4" iodoform gauze, covered wound with gauzed for drainage.  EBL 2cc.  Patient advised to return Monday for packing removal.

## 2013-04-30 ENCOUNTER — Encounter: Payer: Self-pay | Admitting: Family Medicine

## 2013-04-30 ENCOUNTER — Ambulatory Visit (INDEPENDENT_AMBULATORY_CARE_PROVIDER_SITE_OTHER): Payer: 59 | Admitting: Family Medicine

## 2013-04-30 DIAGNOSIS — Z5189 Encounter for other specified aftercare: Secondary | ICD-10-CM

## 2013-04-30 NOTE — Progress Notes (Signed)
  Subjective:    Patient ID: Jermaine Thompson, male    DOB: 06/15/1966, 47 y.o.   MRN: [id number]  HPI He is here for packing removal. He has had no difficulty with this.   Review of Systems     Objective:   Physical Exam Packing was removed from the wound. It was bandaged. Slight amount of bleeding was noted.       Assessment & Plan:  Visit for wound check  he return here if further difficulty.

## 2013-06-13 ENCOUNTER — Other Ambulatory Visit: Payer: Self-pay | Admitting: Cardiovascular Disease

## 2013-06-25 ENCOUNTER — Encounter: Payer: Self-pay | Admitting: Family Medicine

## 2013-06-25 ENCOUNTER — Ambulatory Visit (INDEPENDENT_AMBULATORY_CARE_PROVIDER_SITE_OTHER): Payer: 59 | Admitting: Family Medicine

## 2013-06-25 VITALS — BP 120/80 | HR 84 | Ht 72.0 in | Wt 176.0 lb

## 2013-06-25 DIAGNOSIS — L089 Local infection of the skin and subcutaneous tissue, unspecified: Secondary | ICD-10-CM

## 2013-06-25 DIAGNOSIS — R748 Abnormal levels of other serum enzymes: Secondary | ICD-10-CM

## 2013-06-25 DIAGNOSIS — G4700 Insomnia, unspecified: Secondary | ICD-10-CM

## 2013-06-25 DIAGNOSIS — L723 Sebaceous cyst: Secondary | ICD-10-CM

## 2013-06-25 LAB — COMPREHENSIVE METABOLIC PANEL
ALBUMIN: 4 g/dL (ref 3.5–5.2)
ALT: 17 U/L (ref 0–53)
AST: 24 U/L (ref 0–37)
Alkaline Phosphatase: 75 U/L (ref 39–117)
BUN: 9 mg/dL (ref 6–23)
CALCIUM: 9.3 mg/dL (ref 8.4–10.5)
CHLORIDE: 97 meq/L (ref 96–112)
CO2: 25 meq/L (ref 19–32)
Creat: 0.96 mg/dL (ref 0.50–1.35)
Glucose, Bld: 91 mg/dL (ref 70–99)
POTASSIUM: 4.1 meq/L (ref 3.5–5.3)
Sodium: 136 mEq/L (ref 135–145)
Total Bilirubin: 0.4 mg/dL (ref 0.3–1.2)
Total Protein: 7.5 g/dL (ref 6.0–8.3)

## 2013-06-25 LAB — LIPID PANEL
CHOLESTEROL: 209 mg/dL — AB (ref 0–200)
HDL: 52 mg/dL (ref >39)
Total CHOL/HDL Ratio: 4 Ratio
Triglycerides: 587 mg/dL — ABNORMAL HIGH (ref <150)

## 2013-06-25 MED ORDER — ZOLPIDEM TARTRATE 10 MG PO TABS: 10.0000 mg | ORAL_TABLET | Freq: Every evening | ORAL | Status: AC | PRN

## 2013-06-25 NOTE — Progress Notes (Signed)
   Subjective:    Patient ID: Anna GenreDanny S Driscoll, male    DOB: Aug 05, 1965, 48 y.o.   MRN: [id number]  HPI He is here for consult concerning multiple issues. He continues to have slight drainage from an infected sebaceous cyst in his upper mid back area. He also needs followup on recent elevated liver enzymes. He did stop taking his statin as well as stopped drinking. He does need blood work today. He also is had intermittent difficulty with insomnia. Some of this is stress related. He is now involved in counseling and is making good progress.   Review of Systems     Objective:   Physical Exam Alert and in no distress. Exam of his upper back does show a draining lesion approximately 3 cm in size that is purplish in appearance but nontender.       Assessment & Plan:  Abnormal liver enzymes - Plan: Lipid panel, Comprehensive metabolic panel  Insomnia - Plan: zolpidem (AMBIEN) 10 MG tablet  Infected sebaceous cyst  blood work will be drawn. Discussed his insomnia. He does have sleep hygiene information at home and I have encouraged him to reevaluate this. He is also to discuss his sleep issues with his psychologist. I will give a small prescription of Ambien. He is to set up an appointment to have the cyst reevaluated.

## 2013-06-26 ENCOUNTER — Encounter: Payer: Self-pay | Admitting: Family Medicine

## 2013-06-26 IMAGING — CR DG CHEST 2V
2 series · 2 of 2 positions shown · non-contrast
Comparison: None.

CLINICAL DATA: chest pain

CHEST - 2 VIEW

[w chest pa]
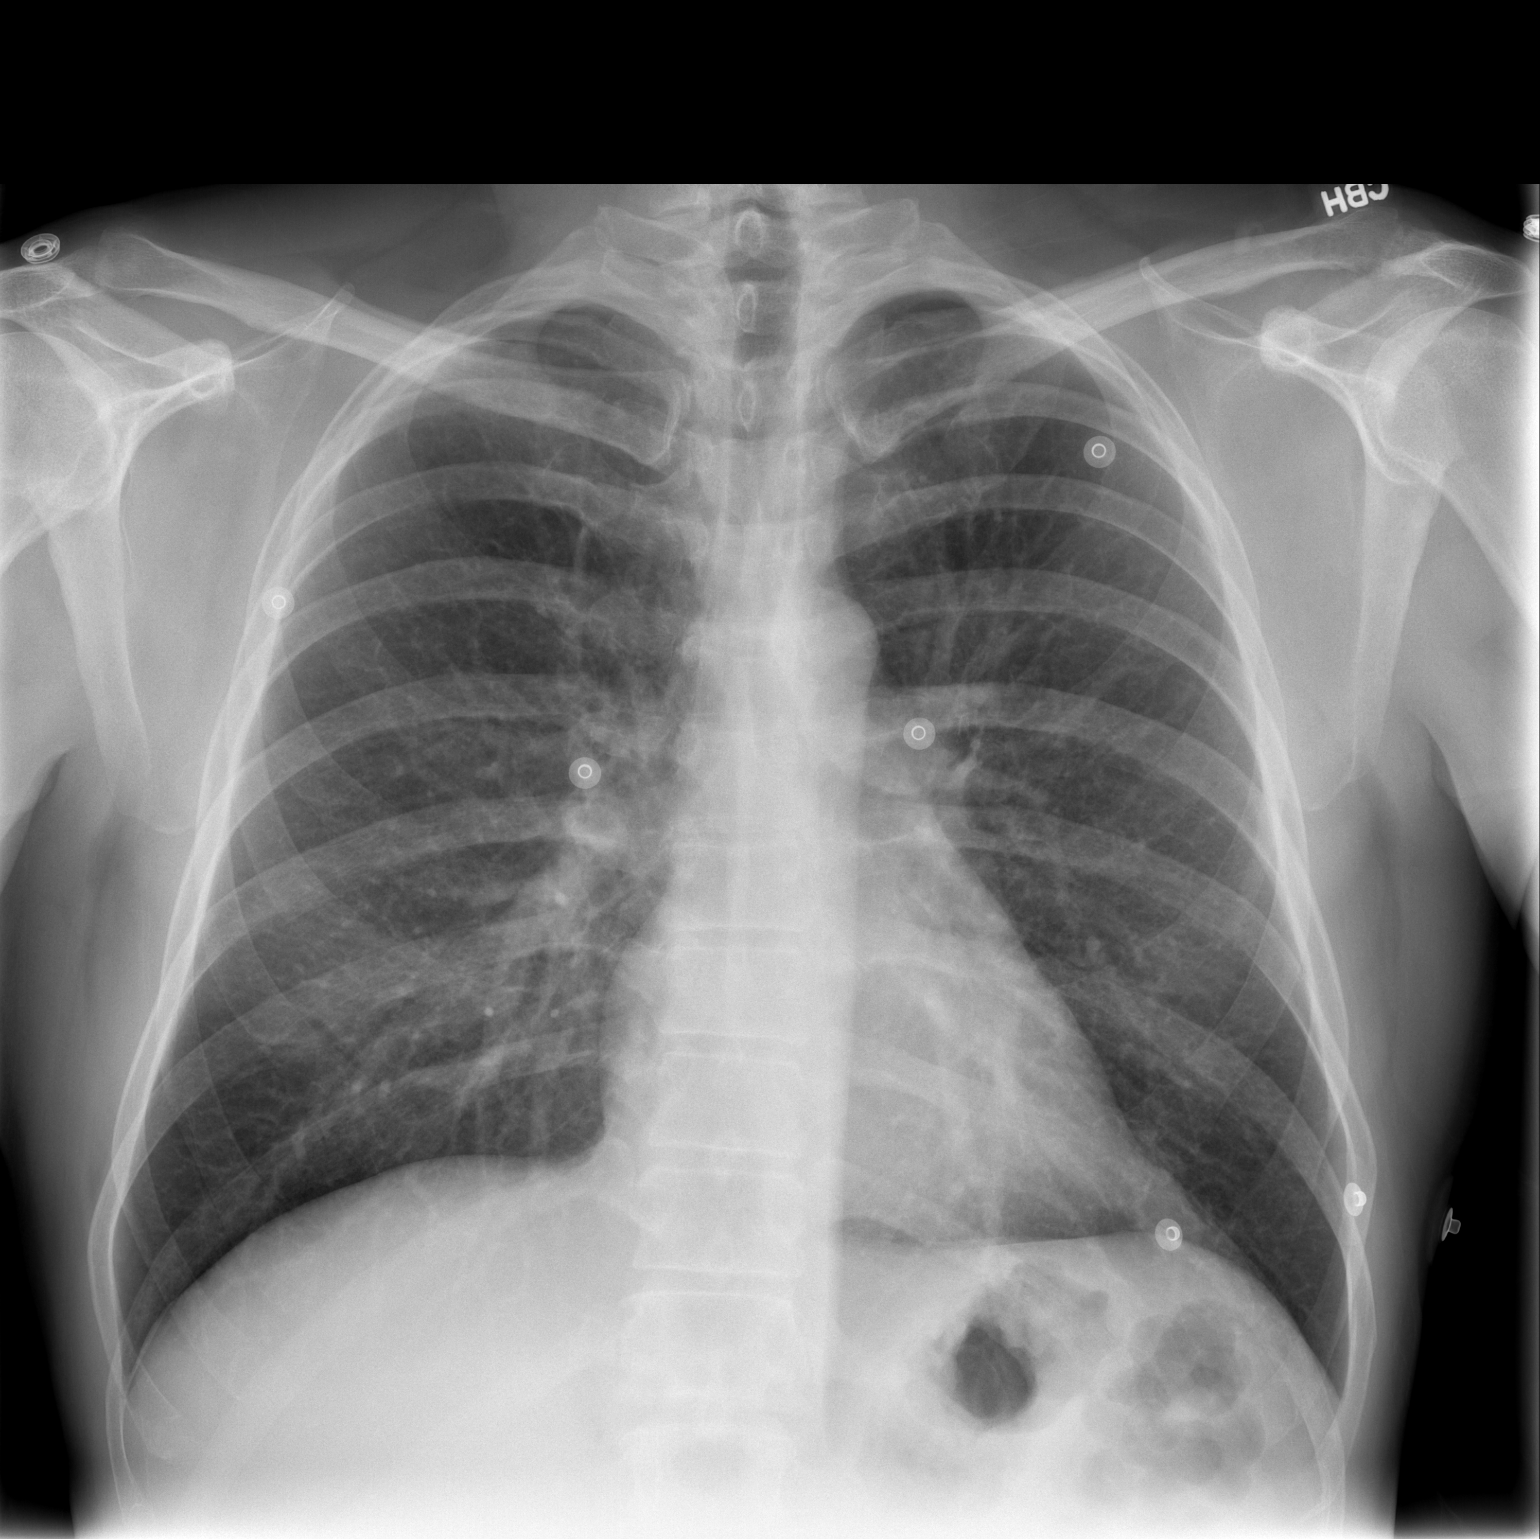

[w chest lat]
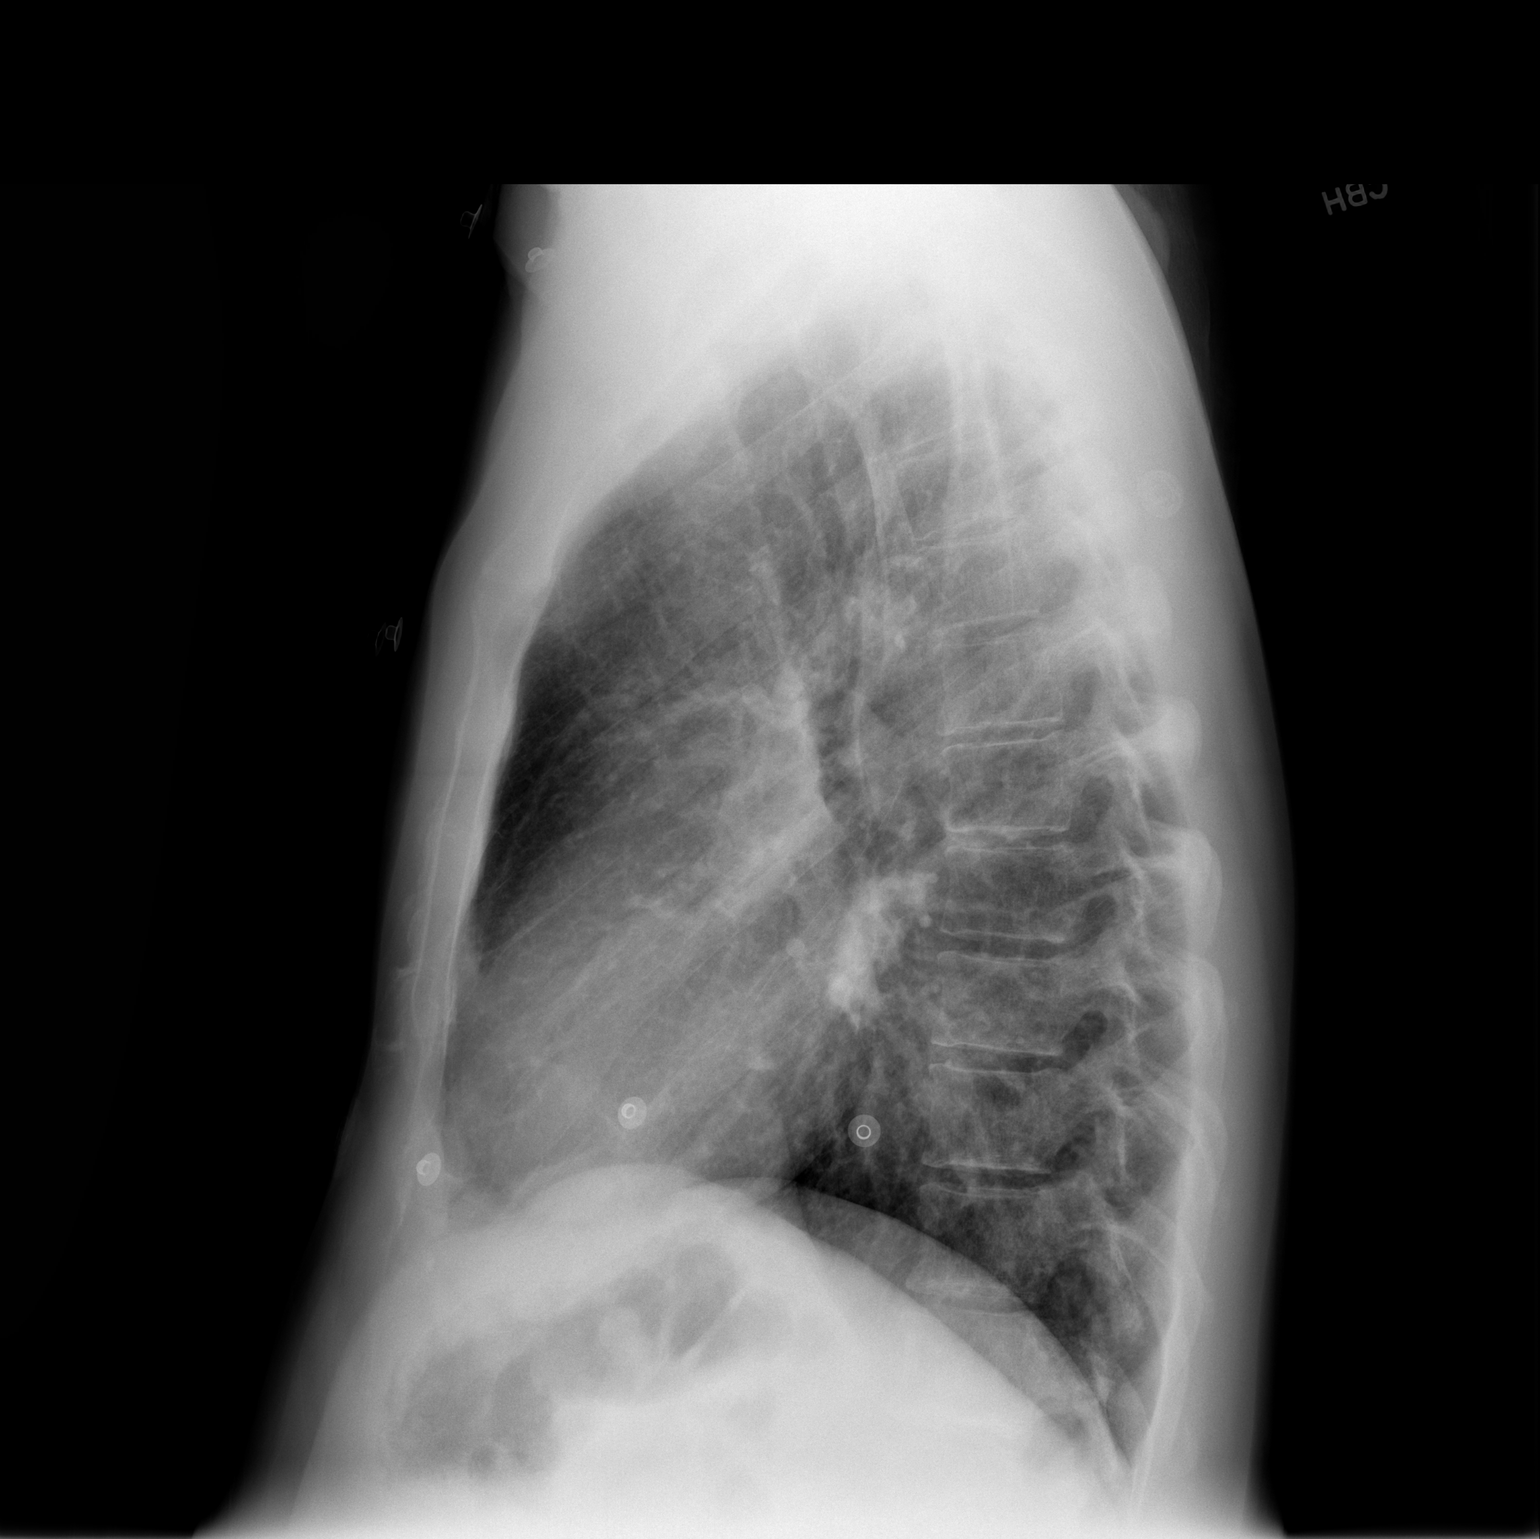

[2 of 2 positions shown; findings below may reference images not displayed]

FINDINGS: Cardiomediastinal silhouette is unremarkable.  No acute
infiltrate or pleural effusion.  No pulmonary edema.  Bony thorax
is stable.
IMPRESSION: No active disease.

## 2013-06-27 ENCOUNTER — Telehealth: Payer: Self-pay | Admitting: Family Medicine

## 2013-06-27 NOTE — Telephone Encounter (Signed)
Pt called and states he saw his labs in My Chart and wants to know if he is to take his cholesterol meds or not? [phone]

## 2013-06-28 NOTE — Telephone Encounter (Signed)
Spoke with pt, states his cardiologist put him on Lipitor. We checked his liver functions and pt was told to hold off on medication. States he has an appt with his cardiologist in March. Do you want him to have lab checked at that office visit or come here to have labs drawn?

## 2013-06-28 NOTE — Telephone Encounter (Signed)
He can get the blood work from his cardiologist. Let him know that I will see you in my record as well

## 2013-06-28 NOTE — Telephone Encounter (Signed)
States that he have Lipitor would this be okay to take.

## 2013-06-28 NOTE — Telephone Encounter (Signed)
He definitely needs to be on cholesterol medications. Place him on Crestor 20 mg daily and have him recheck his numbers in 2 months.

## 2013-06-28 NOTE — Telephone Encounter (Signed)
If he has Lipitor that's fine but he is to take 2 months worth of it and then I can check his numbers.

## 2013-06-29 NOTE — Telephone Encounter (Signed)
Spoke with pt, pt aware.

## 2013-07-05 ENCOUNTER — Other Ambulatory Visit: Payer: Self-pay | Admitting: Cardiovascular Disease

## 2013-07-07 ENCOUNTER — Other Ambulatory Visit: Payer: Self-pay | Admitting: Cardiovascular Disease

## 2013-09-04 ENCOUNTER — Other Ambulatory Visit: Payer: Self-pay

## 2013-09-04 MED ORDER — PANTOPRAZOLE SODIUM 40 MG PO TBEC: DELAYED_RELEASE_TABLET | ORAL | Status: DC

## 2013-09-14 ENCOUNTER — Ambulatory Visit: Payer: 59 | Admitting: Cardiovascular Disease

## 2013-09-17 ENCOUNTER — Ambulatory Visit (INDEPENDENT_AMBULATORY_CARE_PROVIDER_SITE_OTHER): Payer: 59 | Admitting: Cardiovascular Disease

## 2013-09-17 ENCOUNTER — Encounter: Payer: Self-pay | Admitting: Cardiovascular Disease

## 2013-09-17 VITALS — BP 150/82 | HR 92 | Ht 72.0 in | Wt 173.0 lb

## 2013-09-17 DIAGNOSIS — E785 Hyperlipidemia, unspecified: Secondary | ICD-10-CM

## 2013-09-17 DIAGNOSIS — I2510 Atherosclerotic heart disease of native coronary artery without angina pectoris: Secondary | ICD-10-CM

## 2013-09-17 DIAGNOSIS — Z79899 Other long term (current) drug therapy: Secondary | ICD-10-CM

## 2013-09-17 DIAGNOSIS — K219 Gastro-esophageal reflux disease without esophagitis: Secondary | ICD-10-CM

## 2013-09-17 NOTE — Assessment & Plan Note (Signed)
Will try to get lab work from primary  Lipitor stopped then dose decreased to 40 mg  F/U liver and lipid in 3 weeks

## 2013-09-17 NOTE — Progress Notes (Signed)
Patient ID: Jermaine Thompson, male   DOB: 06-26-1965, 48 y.o.   MRN: [id number] Jermaine GenreDanny S Thompson is a 48 y.o. male who returns for follow up after admission 11/13 to the hospital for unstable angina.  He has a history of significant tobacco abuse. He was admitted a few weeks ago with chest pain. MI was ruled out. Outpatient Myoview demonstrated EF of 47% with inferior hypokinesis consistent with prior scar but no ischemia. He returned to the ED with recurrent chest discomfort and syncope. Serial cardiac markers were normal. 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 This was treated with a Xience DES.  Since stenting has not smoked  Still very anxious  Admitted to [facility] in November for vagal sounding episode and atypical pain He indicates lexiscan myovue Was normal.  No recurrence.  Primary stopped lipitor for elevated LFTls  Restarted about 5 weeks ago at dose of 40 mg  Needs repeat labs     ROS: Denies fever, malais, weight loss, blurry vision, decreased visual acuity, cough, sputum, SOB, hemoptysis, pleuritic pain, palpitaitons, heartburn, abdominal pain, melena, lower extremity edema, claudication, or rash.  All other systems reviewed and negative  General: Affect appropriate Appears older than stated age  HEENT: normal Neck supple with no adenopathy JVP normal no bruits no thyromegaly Lungs clear with no wheezing and good diaphragmatic motion Heart:  S1/S2 no murmur, no rub, gallop or click PMI normal Abdomen: benighn, BS positve, no tenderness, no AAA no bruit.  No HSM or HJR Distal pulses intact with no bruits No edema Neuro non-focal Skin warm and dry No muscular weakness   Current Outpatient Prescriptions  Medication Sig Dispense Refill  . aspirin EC 81 MG EC tablet Take 1 tablet (81 mg total) by mouth daily.      Marland Kitchen. losartan (COZAAR) 25 MG tablet TAKE 1 TABLET BY MOUTH EVERY DAY  90 tablet  3  . metoprolol tartrate  (LOPRESSOR) 25 MG tablet TAKE 1 TABLET BY MOUTH TWICE A DAY  180 tablet  1  . nitroGLYCERIN (NITROSTAT) 0.4 MG SL tablet Place 1 tablet (0.4 mg total) under the tongue every 5 (five) minutes x 3 doses as needed for chest pain.  25 tablet  3  . pantoprazole (PROTONIX) 40 MG tablet TAKE 1 TABLET (40 MG TOTAL) BY MOUTH DAILY.  90 tablet  0  . zolpidem (AMBIEN) 10 MG tablet Take 1 tablet (10 mg total) by mouth at bedtime as needed for sleep.  15 tablet  1   No current facility-administered medications for this visit.    Allergies  Nicotine  Electrocardiogram:  SR rate 92 normal   Assessment and Plan

## 2013-09-17 NOTE — Assessment & Plan Note (Signed)
Stable with no angina and good activity level.  Continue medical Rx  

## 2013-09-17 NOTE — Patient Instructions (Signed)
Your physician wants you to follow-up in:   6  MONTHS  WITH  DR Haywood FillerNISHAN  You will receive a reminder letter in the mail two months in advance. If you don't receive a letter, please call our office to schedule the follow-up appointment. Your physician recommends that you continue on your current medications as directed. Please refer to the Current Medication list given to you today. Your physician recommends that you return for lab work in:  LIPID LIVER 3 WEEKS  DUE    LATE  APRIL

## 2013-09-17 NOTE — Assessment & Plan Note (Signed)
Continue protonix  Chronic GERD with need for Rx so plavix not ideal med in future

## 2013-10-03 ENCOUNTER — Other Ambulatory Visit: Payer: Self-pay

## 2013-10-03 MED ORDER — ATORVASTATIN CALCIUM 40 MG PO TABS: 40.0000 mg | ORAL_TABLET | Freq: Every day | ORAL | Status: AC

## 2013-10-08 ENCOUNTER — Other Ambulatory Visit (INDEPENDENT_AMBULATORY_CARE_PROVIDER_SITE_OTHER): Payer: 59

## 2013-10-08 DIAGNOSIS — E785 Hyperlipidemia, unspecified: Secondary | ICD-10-CM

## 2013-10-08 DIAGNOSIS — Z79899 Other long term (current) drug therapy: Secondary | ICD-10-CM

## 2013-10-08 LAB — HEPATIC FUNCTION PANEL
ALBUMIN: 3.2 g/dL — AB (ref 3.5–5.2)
ALK PHOS: 73 U/L (ref 39–117)
ALT: 24 U/L (ref 0–53)
AST: 45 U/L — ABNORMAL HIGH (ref 0–37)
Bilirubin, Direct: 0 mg/dL (ref 0.0–0.3)
Total Bilirubin: 0.9 mg/dL (ref 0.3–1.2)
Total Protein: 6.9 g/dL (ref 6.0–8.3)

## 2013-10-08 LAB — LIPID PANEL
CHOLESTEROL: 115 mg/dL (ref 0–200)
HDL: 41 mg/dL (ref >39.00)
LDL Cholesterol: 19 mg/dL (ref 0–99)
Total CHOL/HDL Ratio: 3
Triglycerides: 275 mg/dL — ABNORMAL HIGH (ref 0.0–149.0)
VLDL: 55 mg/dL — ABNORMAL HIGH (ref 0.0–40.0)

## 2013-10-12 ENCOUNTER — Encounter: Payer: Self-pay | Admitting: Medical

## 2013-10-12 ENCOUNTER — Ambulatory Visit (INDEPENDENT_AMBULATORY_CARE_PROVIDER_SITE_OTHER): Payer: 59 | Admitting: Medical

## 2013-10-12 VITALS — BP 118/60 | HR 112 | Temp 98.1°F

## 2013-10-12 DIAGNOSIS — L0390 Cellulitis, unspecified: Principal | ICD-10-CM

## 2013-10-12 DIAGNOSIS — L0291 Cutaneous abscess, unspecified: Secondary | ICD-10-CM

## 2013-10-12 DIAGNOSIS — T8189XA Other complications of procedures, not elsewhere classified, initial encounter: Secondary | ICD-10-CM

## 2013-10-12 MED ORDER — DOXYCYCLINE HYCLATE 100 MG PO TABS: 100.0000 mg | ORAL_TABLET | Freq: Two times a day (BID) | ORAL | Status: AC

## 2013-10-12 MED ORDER — HYDROCODONE-ACETAMINOPHEN 7.5-325 MG PO TABS: 1.0000 | ORAL_TABLET | Freq: Four times a day (QID) | ORAL | Status: AC | PRN

## 2013-10-12 NOTE — Progress Notes (Signed)
  Subjective:   Jermaine Thompson is a 48 y.o. male who presents for evaluation of cutaneous abscess. Lesion is located in the upper back. Onset was 04/2013.  He originally had an incision and drainage herein November, I saw him 2 days later for packing change, he had an additional followup here on November 10. Since then he said the wound seemed to heal, but never healed fully, he has bumped it a few times on his van leaning up, and in the last week it is gotten swollen red and spontaneous drainage again.  He states it never fully went away. Abscess has associated symptoms of spontaneous drainage, pain.  Patient denies hx/o MRSA. Patient does not have diabetes.  Patient denies hx/o compromised immunity or HIV.  No other aggravating or relieving factors.  No other c/o.  Past Medical History  Diagnosis Date  . CAD in native artery     a. MV11/2013: low risk but with prior inf-basal MI with no ischemia, EF 47%.;  b. LHC 11/13: mRCA 99%, EF 50% with Inf HK => c. PCI:  Xience DES to mRCA    . History of blood transfusion 1980's  . Arthritis     "right knee" (04/28/2012)  . Gout     "once; long time ago" (04/28/2012)  . Tobacco abuse   . HLD (hyperlipidemia)   . Erythrocytosis     ? due to smoking  . Ischemic cardiomyopathy     EF 47% by nuclear and 50% by cath 04/2012  . Myocardial infarction     Reviewed prior allergies, medications, past medical history, past surgical history.  ROS as in subjective   Objective:   There were no vitals filed for this visit.  Gen: wd, wn, nad Skin: There is an area characterized by a subcutaneous mass consistent with a cutaneous abscess, erythema surrounding area measuring 4 cm, induration, tenderness, purulent drainage measuring 4 cm in greatest dimension. Location: upper right back.   Assessment:     Encounter Diagnoses  Name Primary?  . Cellulitis and abscess Yes  . Non-healing surgical wound       Plan:   Discussed examination findings,  diagnosis, usual course of illness, and options for therapy discussed. After discussing recommendations, patient agrees to wound exploration, I&D, oral antibiotics, pain medication prn.   Procedure Informed consent obtained.  The area was prepped in the usual manner and the skin overlying the abscess was anesthetized with 5cc of 1% lidocaine with epinephrine.  The wound already had opening, but wound was explored, approx 1cc of purulent material was obtained.  Area was irrigated with high pressure saline. Packing was inserted. Wound was covered with sterile bandage.    Advised patient to complete the course of oral antibiotics, use warm compresses or heat applied to the area to promote drainage. Pain medication as needed.  Follow up: early next week.  However, if worse signs of infections as discussed (fever, chills, nausea, vomiting, worsening redness, worsening pain), then call or return immediately.

## 2013-10-15 ENCOUNTER — Ambulatory Visit (INDEPENDENT_AMBULATORY_CARE_PROVIDER_SITE_OTHER): Payer: 59 | Admitting: Medical

## 2013-10-15 ENCOUNTER — Encounter: Payer: Self-pay | Admitting: Medical

## 2013-10-15 DIAGNOSIS — L02219 Cutaneous abscess of trunk, unspecified: Secondary | ICD-10-CM

## 2013-10-15 DIAGNOSIS — L03319 Cellulitis of trunk, unspecified: Secondary | ICD-10-CM

## 2013-10-15 DIAGNOSIS — L02212 Cutaneous abscess of back [any part, except buttock]: Secondary | ICD-10-CM

## 2013-10-15 DIAGNOSIS — Z5189 Encounter for other specified aftercare: Secondary | ICD-10-CM

## 2013-10-15 NOTE — Progress Notes (Signed)
Subjective: Here for wound check.  Felt some improvement over weekend, taking antibiotic.  No fever, chills.  Objective: Upper back centrally with raised erythematous borders, somewhat less swollen than last visit.  Packing in place  Assessment: Encounter Diagnoses  Name Primary?  Marland Kitchen. Abscess of back Yes  . Wound check, abscess    Plan: Cleaned and prepped in usual sterile fashion, removed packing, irrigated with high pressure saline, new packing placed.  C/t antibiotic, recheck 3 days.

## 2013-10-18 ENCOUNTER — Ambulatory Visit (INDEPENDENT_AMBULATORY_CARE_PROVIDER_SITE_OTHER): Payer: 59 | Admitting: Family Medicine

## 2013-10-18 ENCOUNTER — Encounter: Payer: Self-pay | Admitting: Medical

## 2013-10-18 VITALS — BP 128/70 | HR 80 | Temp 97.6°F | Resp 16 | Wt 170.0 lb

## 2013-10-18 DIAGNOSIS — L03319 Cellulitis of trunk, unspecified: Secondary | ICD-10-CM

## 2013-10-18 DIAGNOSIS — L02219 Cutaneous abscess of trunk, unspecified: Secondary | ICD-10-CM

## 2013-10-18 DIAGNOSIS — L02212 Cutaneous abscess of back [any part, except buttock]: Secondary | ICD-10-CM

## 2013-10-18 NOTE — Progress Notes (Signed)
   Subjective:    Patient ID: Jermaine Thompson, male    DOB: September 07, 1965, 48 y.o.   MRN: [id number]  HPI He is here for recheck on recent I and D. of an infected sebaceous cyst on his upper mid back. He did state that the doxycycline did cause his arms become quite erythematous when he went in the sun. Is also had some GI distress from this.   Review of Systems     Objective:   Physical Exam Packing was removed and does show a large crater-like lesion however no cyst sac was noted. The margins are clear and no evidence of surrounding erythema warmth or tenderness.       Assessment & Plan:  Abscess of back  recommend he irrigate this several times per day. Since the lesion seems to be healing well. I told him to stop the doxycycline.

## 2013-11-07 ENCOUNTER — Other Ambulatory Visit: Payer: Self-pay | Admitting: *Deleted

## 2013-11-07 MED ORDER — PANTOPRAZOLE SODIUM 40 MG PO TBEC: DELAYED_RELEASE_TABLET | ORAL | Status: DC

## 2013-11-17 ENCOUNTER — Other Ambulatory Visit (HOSPITAL_COMMUNITY): Payer: Self-pay | Admitting: Physician Assistant

## 2013-12-27 ENCOUNTER — Telehealth: Payer: Self-pay | Admitting: Medical

## 2013-12-27 NOTE — Telephone Encounter (Signed)
Pt came by and stated that he wanted labs sent over to Dr. Meredith ModyStein. He states he had a biopsy of a cyst and was referred to the surgical center. I was unable to find any labs in chart but pt was pretty persistent that these had been done. I had pt feel out a medical records request. Please advise if labs were completed.

## 2013-12-27 NOTE — Telephone Encounter (Signed)
I and Dr. Susann GivensLalonde saw him relatively recently for the back infected lesion.  He had some routine labs done in 06/2013 and send these please to surgeon.  However, I did not send any culture or biopsy for testing.  I simply incised and drainage the abscess.  I don't believe Dr. Susann GivensLalonde sent any samples for culture/biopsy either.    So you can send copy of OV notes from where we saw him, but the only recent labs were serology from 06/2013.

## 2013-12-28 NOTE — Telephone Encounter (Signed)
Pt was informed no biopsy was done. Pt will inform surgeon.

## 2014-01-21 ENCOUNTER — Telehealth: Payer: Self-pay | Admitting: Family Medicine

## 2014-01-21 NOTE — Telephone Encounter (Signed)
He's going to call ins agent

## 2014-01-22 ENCOUNTER — Telehealth: Payer: Self-pay | Admitting: Family Medicine

## 2014-01-22 NOTE — Telephone Encounter (Signed)
Records faxed per signed request.

## 2014-04-05 ENCOUNTER — Other Ambulatory Visit: Payer: Self-pay

## 2014-05-30 ENCOUNTER — Encounter (HOSPITAL_COMMUNITY): Payer: Self-pay | Admitting: Cardiovascular Disease

## 2014-10-03 ENCOUNTER — Other Ambulatory Visit: Payer: Self-pay | Admitting: Cardiovascular Disease

## 2014-10-30 ENCOUNTER — Other Ambulatory Visit: Payer: Self-pay | Admitting: Cardiovascular Disease

## 2014-11-02 ENCOUNTER — Other Ambulatory Visit: Payer: Self-pay | Admitting: Cardiovascular Disease

## 2021-02-05 ENCOUNTER — Telehealth: Payer: Self-pay

## 2021-02-05 NOTE — Telephone Encounter (Signed)
Lvm for pt  to call back and advise if he is still a pt of Dr. Susann Givens. If so pt needs a appointment. Last OV was back in 2015 [facility]

## 2022-02-24 ENCOUNTER — Encounter: Payer: Self-pay | Admitting: Internal Medicine

## 2022-04-12 ENCOUNTER — Encounter: Payer: Self-pay | Admitting: Internal Medicine

## 2022-06-30 ENCOUNTER — Encounter: Payer: Self-pay | Admitting: Internal Medicine
